# Patient Record
Sex: Male | Born: 1955 | Hispanic: Yes | Smoking: Never smoker
Health system: Southern US, Community
[De-identification: ages and names within clinical notes are randomized; demographics above are authoritative.]

## PROBLEM LIST (undated history)

## (undated) DIAGNOSIS — K219 Gastro-esophageal reflux disease without esophagitis: Secondary | ICD-10-CM

---

## 2006-05-20 ENCOUNTER — Ambulatory Visit: Payer: Self-pay | Admitting: Gastroenterology

## 2006-05-24 ENCOUNTER — Ambulatory Visit: Payer: Self-pay | Admitting: Cardiology

## 2006-05-29 ENCOUNTER — Ambulatory Visit: Payer: Self-pay | Admitting: Gastroenterology

## 2006-06-03 ENCOUNTER — Ambulatory Visit: Payer: Self-pay | Admitting: Gastroenterology

## 2008-05-17 IMAGING — CT CT PELVIS W/ CM
2 of 5 series · 17 of 46 positions shown, 19 images · IV contrast (APPLIED)
Comparison: none

CLINICAL DATA: Left upper quadrant pain.  Right flank pain. 
 ABDOMEN CT WITH CONTRAST:
TECHNIQUE: Multidetector CT imaging of the abdomen was performed following the standard protocol during bolus administration of intravenous contrast.
 Contrast:  125 cc Omnipaque 300.
TECHNIQUE: Multidetector CT imaging of the pelvis was performed following the standard protocol during bolus administration of intravenous contrast.

[Series 2: abd_pel 5.0 b30f st · axial · 0.76mm/px · z∈[-382,-26]mm · 14 of 81 slices shown, 16 images]
[im 5/81  soft-tissue]
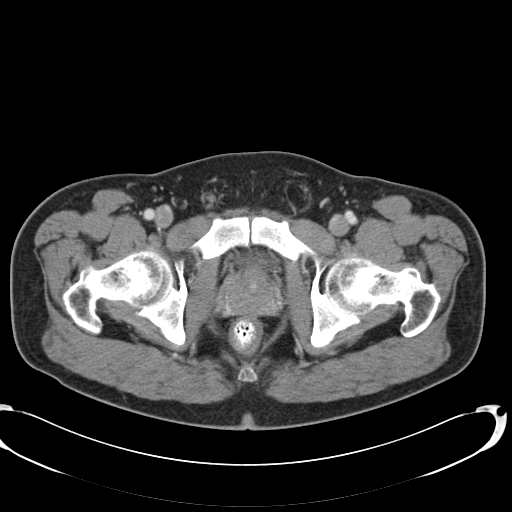
[im 5/81  bone]
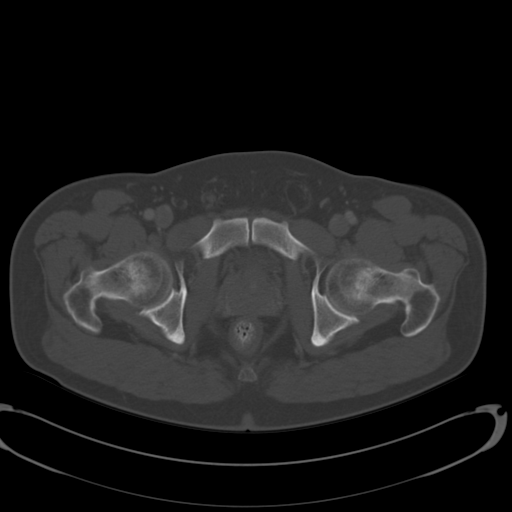
[im 9/81  soft-tissue]
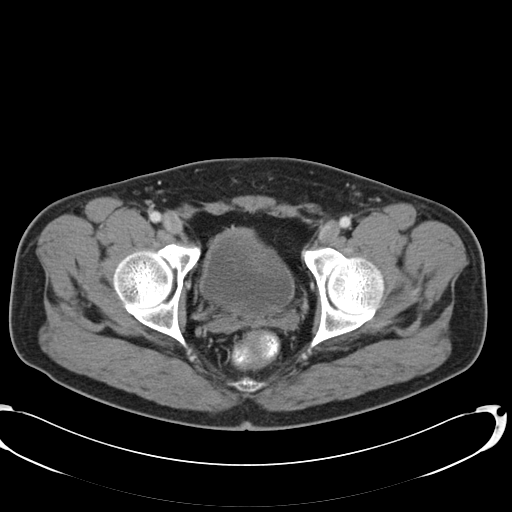
[im 18/81  soft-tissue]
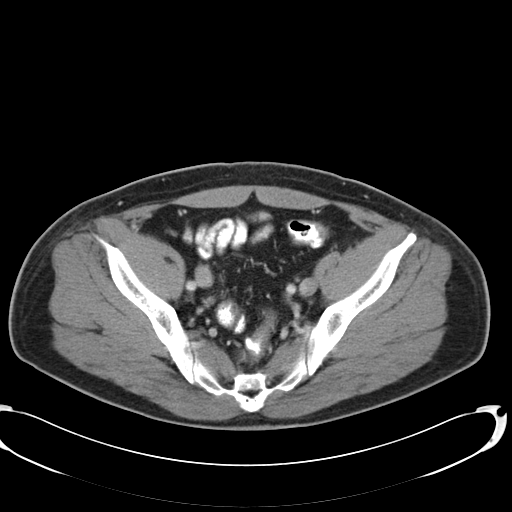
[im 23/81  soft-tissue]
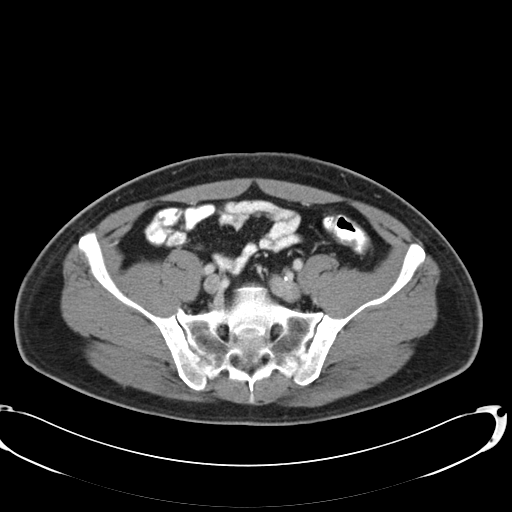
[im 27/81  soft-tissue]
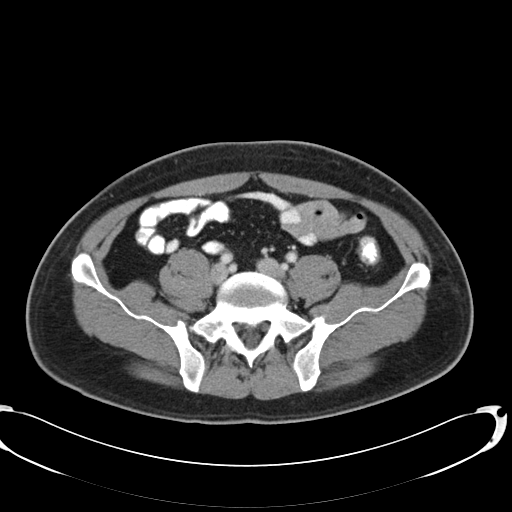
[im 32/81  soft-tissue]
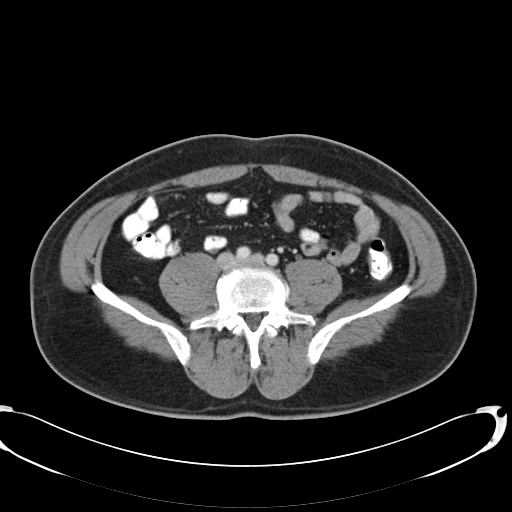
[im 36/81  soft-tissue]
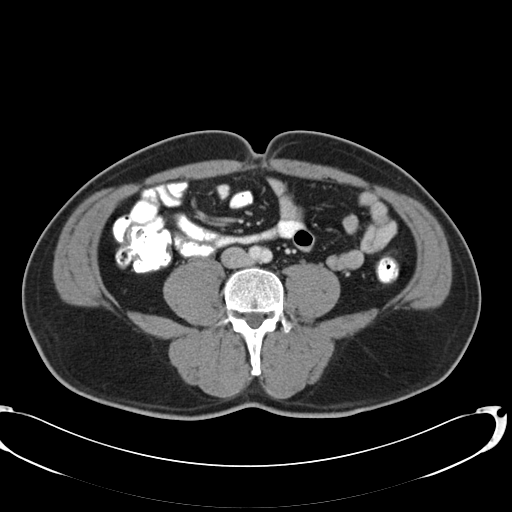
[im 45/81  soft-tissue]
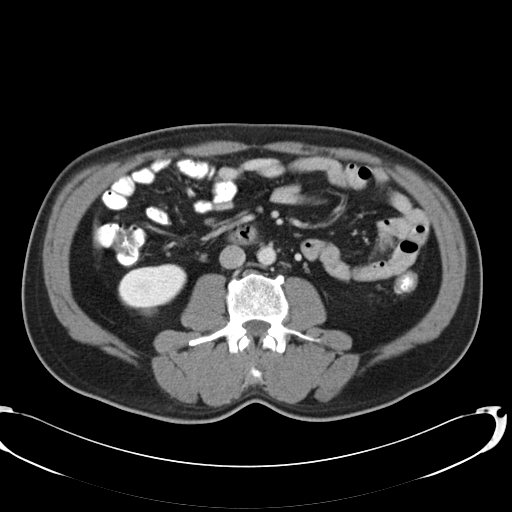
[im 49/81  soft-tissue]
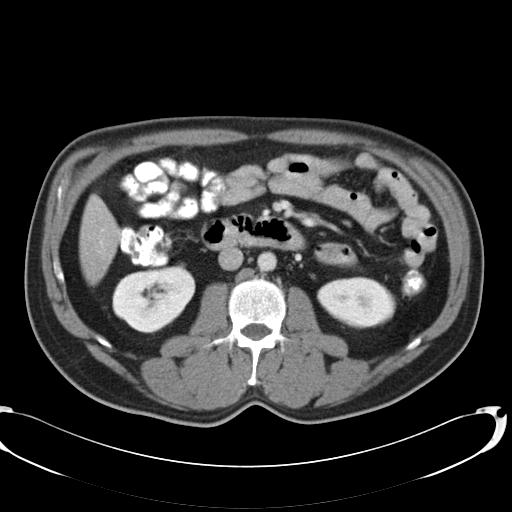
[im 49/81  bone]
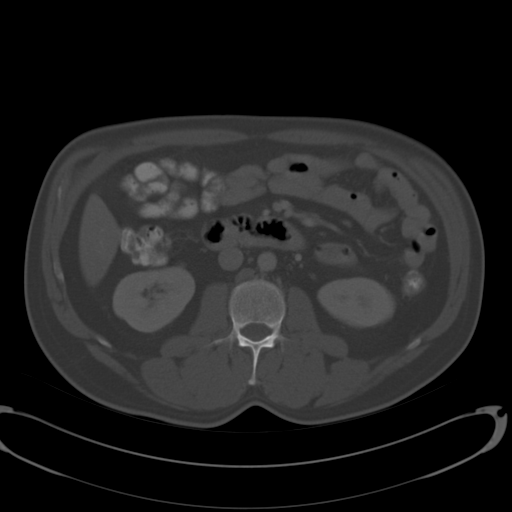
[im 54/81  soft-tissue]
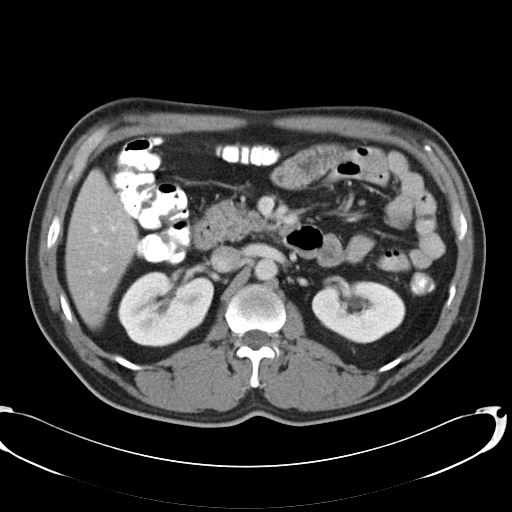
[im 58/81  soft-tissue]
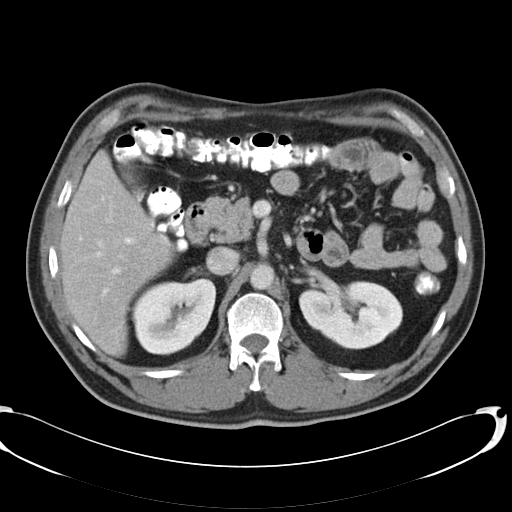
[im 63/81  soft-tissue]
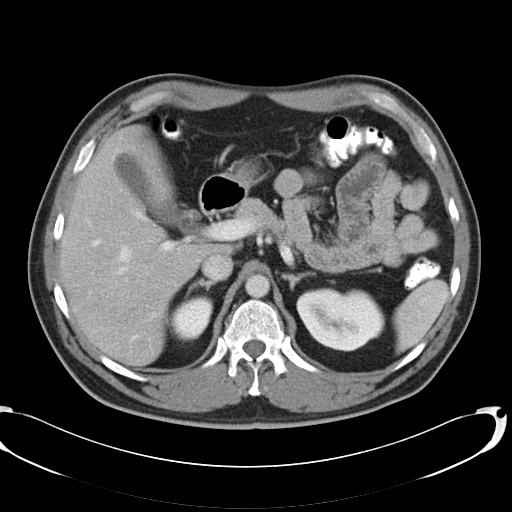
[im 72/81  soft-tissue]
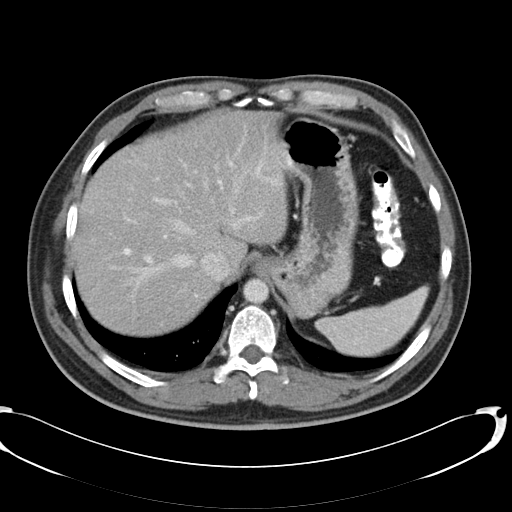
[im 76/81  soft-tissue]
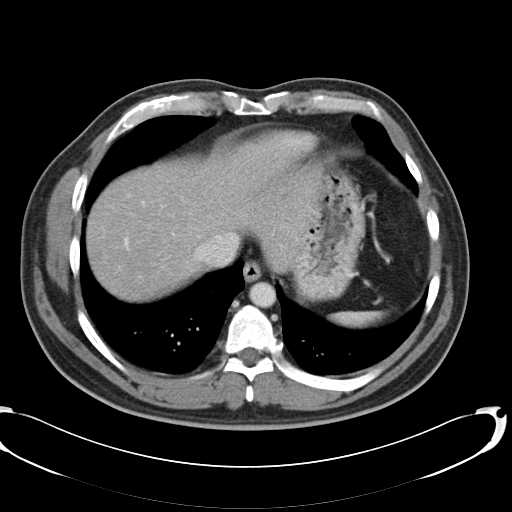

[Series 602: <mpr range> · coronal · 0.80mm/px · 3 of 39 slices shown]
[im 13/39  soft-tissue]
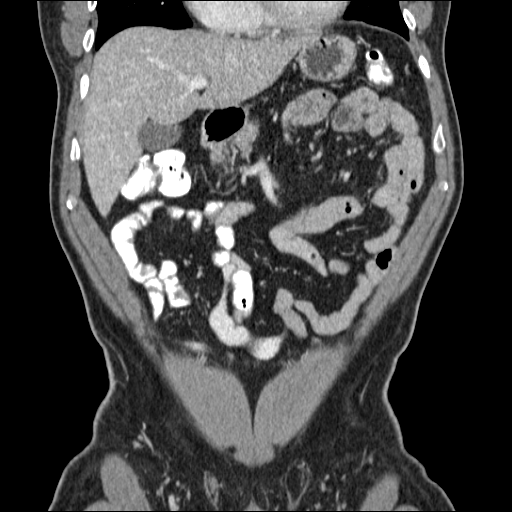
[im 17/39  soft-tissue]
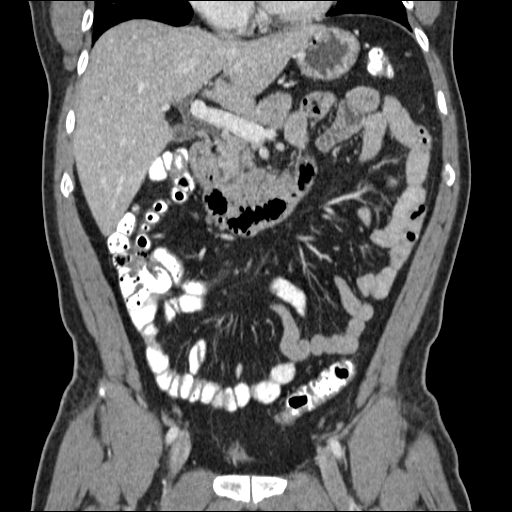
[im 22/39  soft-tissue]
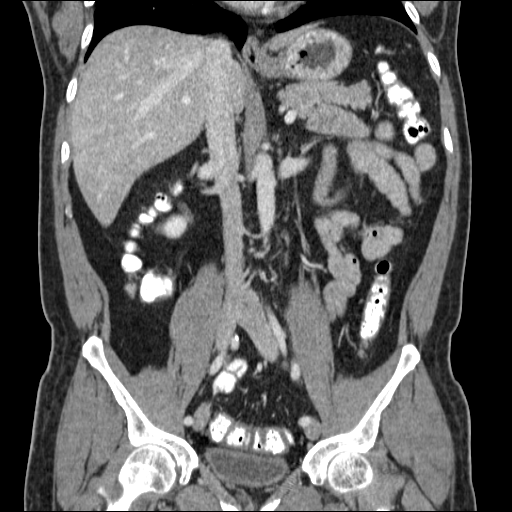

[17 of 46 positions shown; findings below may reference images not displayed]

FINDINGS: Lung bases are clear.  No pleural or pericardial effusion.  The spleen, adrenal glands, kidneys, pancreas, gallbladder, and biliary tree are all unremarkable.  There is a tiny low attenuation lesion in the right lobe of the liver likely representing a small cyst.  No other focal liver lesion.  Stomach and small bowel appear normal.  There is no abdominal lymphadenopathy or fluid collection.  No focal bony abnormality.
IMPRESSION: No acute finding in the abdomen with a small hepatic cyst noted. 
 PELVIS CT WITH CONTRAST:
FINDINGS: Colon is normal in appearance.  Appendix is well seen and also normal.  There is no pelvic fluid collection or lymphadenopathy.  No focal bony abnormality.
IMPRESSION: Negative pelvic CT.

## 2011-08-31 ENCOUNTER — Ambulatory Visit (INDEPENDENT_AMBULATORY_CARE_PROVIDER_SITE_OTHER): Payer: Self-pay

## 2011-08-31 DIAGNOSIS — J159 Unspecified bacterial pneumonia: Secondary | ICD-10-CM

## 2014-04-26 ENCOUNTER — Ambulatory Visit (INDEPENDENT_AMBULATORY_CARE_PROVIDER_SITE_OTHER): Payer: BC Managed Care – PPO | Admitting: Emergency Medicine

## 2014-04-26 VITALS — BP 142/68 | HR 71 | Temp 97.5°F | Resp 16 | Ht 62.5 in | Wt 152.6 lb

## 2014-04-26 DIAGNOSIS — K2970 Gastritis, unspecified, without bleeding: Secondary | ICD-10-CM

## 2014-04-26 DIAGNOSIS — K219 Gastro-esophageal reflux disease without esophagitis: Secondary | ICD-10-CM

## 2014-04-26 DIAGNOSIS — R079 Chest pain, unspecified: Secondary | ICD-10-CM

## 2014-04-26 DIAGNOSIS — K2990 Gastroduodenitis, unspecified, without bleeding: Secondary | ICD-10-CM

## 2014-04-26 LAB — POCT CBC
Granulocyte percent: 64.2 %G (ref 37–80)
HCT, POC: 45 % (ref 43.5–53.7)
HEMOGLOBIN: 14.4 g/dL (ref 14.1–18.1)
Lymph, poc: 2 (ref 0.6–3.4)
MCH: 34.5 pg — AB (ref 27–31.2)
MCHC: 31.9 g/dL (ref 31.8–35.4)
MCV: 108.1 fL — AB (ref 80–97)
MID (CBC): 0.5 (ref 0–0.9)
MPV: 6.6 fL (ref 0–99.8)
PLATELET COUNT, POC: 286 10*3/uL (ref 142–424)
POC GRANULOCYTE: 4.5 (ref 2–6.9)
POC LYMPH PERCENT: 28.3 %L (ref 10–50)
POC MID %: 7.5 % (ref 0–12)
RBC: 4.16 M/uL — AB (ref 4.69–6.13)
RDW, POC: 16.9 %
WBC: 7 10*3/uL (ref 4.6–10.2)

## 2014-04-26 LAB — COMPREHENSIVE METABOLIC PANEL
ALT: 33 U/L (ref 0–53)
AST: 28 U/L (ref 0–37)
Albumin: 4.7 g/dL (ref 3.5–5.2)
Alkaline Phosphatase: 122 U/L — ABNORMAL HIGH (ref 39–117)
BILIRUBIN TOTAL: 0.3 mg/dL (ref 0.2–1.2)
BUN: 16 mg/dL (ref 6–23)
CALCIUM: 9.6 mg/dL (ref 8.4–10.5)
CHLORIDE: 102 meq/L (ref 96–112)
CO2: 24 meq/L (ref 19–32)
CREATININE: 0.68 mg/dL (ref 0.50–1.35)
Glucose, Bld: 91 mg/dL (ref 70–99)
Potassium: 4.1 mEq/L (ref 3.5–5.3)
SODIUM: 136 meq/L (ref 135–145)
TOTAL PROTEIN: 7.6 g/dL (ref 6.0–8.3)

## 2014-04-26 LAB — LIPID PANEL
CHOL/HDL RATIO: 4.5 ratio
CHOLESTEROL: 125 mg/dL (ref 0–200)
HDL: 28 mg/dL — ABNORMAL LOW (ref >39)
LDL Cholesterol: 35 mg/dL (ref 0–99)
Triglycerides: 309 mg/dL — ABNORMAL HIGH (ref <150)
VLDL: 62 mg/dL — AB (ref 0–40)

## 2014-04-26 MED ORDER — LANSOPRAZOLE 30 MG PO CPDR: 30.0000 mg | DELAYED_RELEASE_CAPSULE | Freq: Every day | ORAL | Status: DC

## 2014-04-26 MED ORDER — SUCRALFATE 1 G PO TABS
ORAL_TABLET | ORAL | Status: DC
Start: 2014-04-26 — End: 2016-03-03

## 2014-04-26 NOTE — Patient Instructions (Signed)
Gastroesophageal Reflux Disease, Adult Gastroesophageal reflux disease (GERD) happens when acid from your stomach flows up into the esophagus. When acid comes in contact with the esophagus, the acid causes soreness (inflammation) in the esophagus. Over time, GERD may create small holes (ulcers) in the lining of the esophagus. CAUSES   Increased body weight. This puts pressure on the stomach, making acid rise from the stomach into the esophagus.  Smoking. This increases acid production in the stomach.  Drinking alcohol. This causes decreased pressure in the lower esophageal sphincter (valve or ring of muscle between the esophagus and stomach), allowing acid from the stomach into the esophagus.  Late evening meals and a full stomach. This increases pressure and acid production in the stomach.  A malformed lower esophageal sphincter. Sometimes, no cause is found. SYMPTOMS   Burning pain in the lower part of the mid-chest behind the breastbone and in the mid-stomach area. This may occur twice a week or more often.  Trouble swallowing.  Sore throat.  Dry cough.  Asthma-like symptoms including chest tightness, shortness of breath, or wheezing. DIAGNOSIS  Your caregiver may be able to diagnose GERD based on your symptoms. In some cases, X-rays and other tests may be done to check for complications or to check the condition of your stomach and esophagus. TREATMENT  Your caregiver may recommend over-the-counter or prescription medicines to help decrease acid production. Ask your caregiver before starting or adding any new medicines.  HOME CARE INSTRUCTIONS   Change the factors that you can control. Ask your caregiver for guidance concerning weight loss, quitting smoking, and alcohol consumption.  Avoid foods and drinks that make your symptoms worse, such as:  Caffeine or alcoholic drinks.  Chocolate.  Peppermint or mint flavorings.  Garlic and onions.  Spicy foods.  Citrus fruits,  such as oranges, lemons, or limes.  Tomato-based foods such as sauce, chili, salsa, and pizza.  Fried and fatty foods.  Avoid lying down for the 3 hours prior to your bedtime or prior to taking a nap.  Eat small, frequent meals instead of large meals.  Wear loose-fitting clothing. Do not wear anything tight around your waist that causes pressure on your stomach.  Raise the head of your bed 6 to 8 inches with wood blocks to help you sleep. Extra pillows will not help.  Only take over-the-counter or prescription medicines for pain, discomfort, or fever as directed by your caregiver.  Do not take aspirin, ibuprofen, or other nonsteroidal anti-inflammatory drugs (NSAIDs). SEEK IMMEDIATE MEDICAL CARE IF:   You have pain in your arms, neck, jaw, teeth, or back.  Your pain increases or changes in intensity or duration.  You develop nausea, vomiting, or sweating (diaphoresis).  You develop shortness of breath, or you faint.  Your vomit is green, yellow, black, or looks like coffee grounds or blood.  Your stool is red, bloody, or black. These symptoms could be signs of other problems, such as heart disease, gastric bleeding, or esophageal bleeding. MAKE SURE YOU:   Understand these instructions.  Will watch your condition.  Will get help right away if you are not doing well or get worse. Document Released: 05/23/2005 Document Revised: 11/05/2011 Document Reviewed: 03/02/2011 Exit[employer] Patient Information 2015 [employer]. This information is not intended to replace advice given to you by your health care provider. Make sure you discuss any questions you have with your health care provider.  

## 2014-04-26 NOTE — Progress Notes (Signed)
[facility] [address] [place] [address] [phone]  Date:  04/26/2014   Name:  Jose Compton   DOB:  08/11/1956   MRN:  [id number]  PCP:  No PCP Per Patient    Chief Complaint: Other and Chest Pain   History of Present Illness:  Jose Compton is a 58 y.o. very pleasant male patient who presents with the following:  Three day history of pain in left upper chest. No radiation of pain.  Pain worse when lays down at night.  No cough. No nausea or vomiting.  No shortness of breath, no diaphoresis.  No palpitations or tachycardia. Non smoker.  No hypertension.  No DM, HLD.   No history of CAD.   History of GERD off medications. Pain tearing, not dull or heavy.  Intolerance to spicy food.  No other food intolerance. No improvement with over the counter medications or other home remedies. Denies other complaint or health concern today.   There are no active problems to display for this patient.   History reviewed. No pertinent past medical history.  History reviewed. No pertinent past surgical history.  History  Substance Use Topics  . Smoking status: Never Smoker   . Smokeless tobacco: Not on file  . Alcohol Use: No    Family History  Problem Relation Age of Onset  . Stroke Mother     No Known Allergies  Medication list has been reviewed and updated.  No current outpatient prescriptions on file prior to visit.   No current facility-administered medications on file prior to visit.    Review of Systems:  As per HPI, otherwise negative.    Physical Examination: Filed Vitals:   04/26/14 1441  BP: 142/68  Pulse: 71  Temp: 97.5 F (36.4 C)  Resp: 16   Filed Vitals:   04/26/14 1441  Height: 5' 2.5" (1.588 m)  Weight: 152 lb 9.6 oz (69.219 kg)   Body mass index is 27.45 kg/(m^2). Ideal Body Weight: Weight in (lb) to have BMI = 25: 138.6  GEN: WDWN, NAD, Non-toxic, A & O x 3 HEENT: Atraumatic, Normocephalic. Neck supple.  No masses, No LAD. Ears and Nose: No external deformity. CV: RRR, No M/G/R. No JVD. No thrill. No extra heart sounds. PULM: CTA B, no wheezes, crackles, rhonchi. No retractions. No resp. distress. No accessory muscle use. ABD: S, NT, ND, +BS. No rebound. No HSM. EXTR: No c/c/e NEURO Normal gait.  PSYCH: Normally interactive. Conversant. Not depressed or anxious appearing.  Calm demeanor.    Assessment and Plan: Chest pain Labs pending Gastritis vs reflux Prevacid carafate  Signed,  Phillips Odor, MD

## 2014-04-27 LAB — H. PYLORI ANTIBODY, IGG

## 2016-02-28 ENCOUNTER — Inpatient Hospital Stay (HOSPITAL_COMMUNITY)
Admission: EM | Admit: 2016-02-28 | Discharge: 2016-03-03 | DRG: 378 | Disposition: A | Discharge status: Home / Self Care | Payer: BLUE CROSS/BLUE SHIELD | Attending: Internal Medicine | Admitting: Internal Medicine

## 2016-02-28 ENCOUNTER — Encounter (HOSPITAL_COMMUNITY): Payer: Self-pay | Admitting: *Deleted

## 2016-02-28 DIAGNOSIS — D7589 Other specified diseases of blood and blood-forming organs: Secondary | ICD-10-CM | POA: Diagnosis present

## 2016-02-28 DIAGNOSIS — K922 Gastrointestinal hemorrhage, unspecified: Secondary | ICD-10-CM | POA: Diagnosis present

## 2016-02-28 DIAGNOSIS — R1310 Dysphagia, unspecified: Secondary | ICD-10-CM | POA: Diagnosis present

## 2016-02-28 DIAGNOSIS — D696 Thrombocytopenia, unspecified: Secondary | ICD-10-CM | POA: Insufficient documentation

## 2016-02-28 DIAGNOSIS — R109 Unspecified abdominal pain: Secondary | ICD-10-CM | POA: Diagnosis not present

## 2016-02-28 DIAGNOSIS — R12 Heartburn: Secondary | ICD-10-CM

## 2016-02-28 DIAGNOSIS — K219 Gastro-esophageal reflux disease without esophagitis: Secondary | ICD-10-CM | POA: Diagnosis present

## 2016-02-28 DIAGNOSIS — D531 Other megaloblastic anemias, not elsewhere classified: Secondary | ICD-10-CM | POA: Diagnosis present

## 2016-02-28 DIAGNOSIS — D539 Nutritional anemia, unspecified: Secondary | ICD-10-CM | POA: Diagnosis present

## 2016-02-28 DIAGNOSIS — D510 Vitamin B12 deficiency anemia due to intrinsic factor deficiency: Secondary | ICD-10-CM | POA: Diagnosis present

## 2016-02-28 DIAGNOSIS — D61818 Other pancytopenia: Secondary | ICD-10-CM | POA: Diagnosis present

## 2016-02-28 DIAGNOSIS — Z79899 Other long term (current) drug therapy: Secondary | ICD-10-CM

## 2016-02-28 DIAGNOSIS — R195 Other fecal abnormalities: Secondary | ICD-10-CM | POA: Diagnosis present

## 2016-02-28 DIAGNOSIS — D649 Anemia, unspecified: Secondary | ICD-10-CM

## 2016-02-28 DIAGNOSIS — D519 Vitamin B12 deficiency anemia, unspecified: Secondary | ICD-10-CM | POA: Diagnosis present

## 2016-02-28 DIAGNOSIS — E876 Hypokalemia: Secondary | ICD-10-CM | POA: Diagnosis present

## 2016-02-28 DIAGNOSIS — Z823 Family history of stroke: Secondary | ICD-10-CM

## 2016-02-28 HISTORY — DX: Gastro-esophageal reflux disease without esophagitis: K21.9

## 2016-02-28 LAB — COMPREHENSIVE METABOLIC PANEL
ALBUMIN: 4.1 g/dL (ref 3.5–5.0)
ALT: 22 U/L (ref 17–63)
ANION GAP: 5 (ref 5–15)
AST: 34 U/L (ref 15–41)
Alkaline Phosphatase: 53 U/L (ref 38–126)
BUN: 19 mg/dL (ref 6–20)
CALCIUM: 7.9 mg/dL — AB (ref 8.9–10.3)
CHLORIDE: 104 mmol/L (ref 101–111)
CO2: 22 mmol/L (ref 22–32)
CREATININE: 0.79 mg/dL (ref 0.61–1.24)
GFR calc non Af Amer: >60 mL/min (ref >60)
Glucose, Bld: 95 mg/dL (ref 65–99)
Potassium: 3.2 mmol/L — ABNORMAL LOW (ref 3.5–5.1)
SODIUM: 131 mmol/L — AB (ref 135–145)
TOTAL PROTEIN: 6.2 g/dL — AB (ref 6.5–8.1)
Total Bilirubin: 1.5 mg/dL — ABNORMAL HIGH (ref 0.3–1.2)

## 2016-02-28 LAB — ABO/RH: ABO/RH(D): O POS

## 2016-02-28 LAB — URINALYSIS, ROUTINE W REFLEX MICROSCOPIC
BILIRUBIN URINE: NEGATIVE
Glucose, UA: NEGATIVE mg/dL
Hgb urine dipstick: NEGATIVE
KETONES UR: NEGATIVE mg/dL
LEUKOCYTES UA: NEGATIVE
NITRITE: NEGATIVE
PH: 5.5 (ref 5.0–8.0)
PROTEIN: NEGATIVE mg/dL
Specific Gravity, Urine: 1.02 (ref 1.005–1.030)

## 2016-02-28 LAB — CBC
HCT: 12.1 % — ABNORMAL LOW (ref 39.0–52.0)
Hemoglobin: 4.4 g/dL — CL (ref 13.0–17.0)
MCH: 41.5 pg — AB (ref 26.0–34.0)
MCHC: 36.4 g/dL — ABNORMAL HIGH (ref 30.0–36.0)
MCV: 114.2 fL — ABNORMAL HIGH (ref 78.0–100.0)
PLATELETS: 85 10*3/uL — AB (ref 150–400)
RBC: 1.06 MIL/uL — AB (ref 4.22–5.81)
RDW: 24.9 % — ABNORMAL HIGH (ref 11.5–15.5)
WBC: 3.2 10*3/uL — ABNORMAL LOW (ref 4.0–10.5)

## 2016-02-28 LAB — DIFFERENTIAL
BASOS PCT: 0 %
Basophils Absolute: 0 10*3/uL (ref 0.0–0.1)
EOS ABS: 0.2 10*3/uL (ref 0.0–0.7)
Eosinophils Relative: 5 %
LYMPHS ABS: 1.4 10*3/uL (ref 0.7–4.0)
Lymphocytes Relative: 43 %
MONO ABS: 0.1 10*3/uL (ref 0.1–1.0)
Monocytes Relative: 4 %
NEUTROS ABS: 1.5 10*3/uL — AB (ref 1.7–7.7)
NEUTROS PCT: 48 %
WBC Morphology: INCREASED

## 2016-02-28 LAB — PREPARE RBC (CROSSMATCH)

## 2016-02-28 LAB — POC OCCULT BLOOD, ED: Fecal Occult Bld: POSITIVE — AB

## 2016-02-28 LAB — LIPASE, BLOOD: LIPASE: 31 U/L (ref 11–51)

## 2016-02-28 MED ORDER — SODIUM CHLORIDE 0.9 % IV BOLUS (SEPSIS)
1000.0000 mL | Freq: Once | INTRAVENOUS | Status: AC
  Administered 2016-02-28: 1000 mL via INTRAVENOUS

## 2016-02-28 MED ORDER — ONDANSETRON HCL 4 MG/2ML IJ SOLN
4.0000 mg | Freq: Once | INTRAMUSCULAR | Status: DC | PRN
  Filled 2016-02-28: qty 2

## 2016-02-28 MED ORDER — METOCLOPRAMIDE HCL 5 MG/ML IJ SOLN
10.0000 mg | Freq: Once | INTRAMUSCULAR | Status: AC
  Administered 2016-02-28: 10 mg via INTRAVENOUS
  Filled 2016-02-28: qty 2

## 2016-02-28 MED ORDER — SODIUM CHLORIDE 0.9 % IV BOLUS (SEPSIS): 1000.0000 mL | Freq: Once | INTRAVENOUS | Status: DC

## 2016-02-28 MED ORDER — FAMOTIDINE IN NACL 20-0.9 MG/50ML-% IV SOLN
20.0000 mg | Freq: Once | INTRAVENOUS | Status: AC
  Administered 2016-02-28: 20 mg via INTRAVENOUS
  Filled 2016-02-28: qty 50

## 2016-02-28 NOTE — ED Provider Notes (Signed)
Complains of left-sided abdominal pain onset this morning. Pain is mild radiates to left flank. No associated nausea or vomiting. No blood per rectum. Also complains of generalized weakness for approximately a week. On exam alert no distress HEENT exam conjunctiva are pale lungs clear auscultation heart regular rate and rhythm abdomen mildly tender at left mid abdomen no guarding rigidity or rebound no masses. No flank tenderness. Declines pain medicine presently  Doug SouSam Carlye Panameno, MD 02/29/16 [phone]

## 2016-02-28 NOTE — ED Notes (Signed)
Pt c/o abd pain / radiating to both sides; pt states that he was diagnosed with GERD over a month ago and has been taking prilosec and omperazole for over a month without improvement; pt states that he has burning to his stomach and throat; pt also c/o flank pain and states that he has been urinating more frequently and has had blood in it at times over that last few days; pt c/o nausea with no vomiting

## 2016-02-28 NOTE — ED Notes (Signed)
Lab called asked for recollect/ Took 20cc ( wasted 10 cc ) And called lab to let know the recollect was tubed up    Note peptid iv and bolus were stopped.

## 2016-02-29 ENCOUNTER — Inpatient Hospital Stay (HOSPITAL_COMMUNITY): Payer: BLUE CROSS/BLUE SHIELD

## 2016-02-29 ENCOUNTER — Encounter (HOSPITAL_COMMUNITY): Payer: Self-pay

## 2016-02-29 ENCOUNTER — Emergency Department (HOSPITAL_COMMUNITY): Payer: BLUE CROSS/BLUE SHIELD

## 2016-02-29 DIAGNOSIS — K219 Gastro-esophageal reflux disease without esophagitis: Secondary | ICD-10-CM | POA: Diagnosis present

## 2016-02-29 DIAGNOSIS — R109 Unspecified abdominal pain: Secondary | ICD-10-CM | POA: Diagnosis present

## 2016-02-29 DIAGNOSIS — E876 Hypokalemia: Secondary | ICD-10-CM | POA: Diagnosis present

## 2016-02-29 DIAGNOSIS — D61818 Other pancytopenia: Secondary | ICD-10-CM | POA: Diagnosis present

## 2016-02-29 DIAGNOSIS — Z79899 Other long term (current) drug therapy: Secondary | ICD-10-CM | POA: Diagnosis not present

## 2016-02-29 DIAGNOSIS — R195 Other fecal abnormalities: Secondary | ICD-10-CM

## 2016-02-29 DIAGNOSIS — R1310 Dysphagia, unspecified: Secondary | ICD-10-CM | POA: Diagnosis present

## 2016-02-29 DIAGNOSIS — K922 Gastrointestinal hemorrhage, unspecified: Secondary | ICD-10-CM | POA: Diagnosis present

## 2016-02-29 DIAGNOSIS — Z823 Family history of stroke: Secondary | ICD-10-CM | POA: Diagnosis not present

## 2016-02-29 DIAGNOSIS — R531 Weakness: Secondary | ICD-10-CM | POA: Diagnosis not present

## 2016-02-29 DIAGNOSIS — D649 Anemia, unspecified: Secondary | ICD-10-CM | POA: Diagnosis not present

## 2016-02-29 DIAGNOSIS — D510 Vitamin B12 deficiency anemia due to intrinsic factor deficiency: Secondary | ICD-10-CM | POA: Diagnosis present

## 2016-02-29 DIAGNOSIS — D519 Vitamin B12 deficiency anemia, unspecified: Secondary | ICD-10-CM | POA: Diagnosis not present

## 2016-02-29 DIAGNOSIS — D696 Thrombocytopenia, unspecified: Secondary | ICD-10-CM | POA: Diagnosis not present

## 2016-02-29 DIAGNOSIS — D531 Other megaloblastic anemias, not elsewhere classified: Secondary | ICD-10-CM | POA: Diagnosis present

## 2016-02-29 DIAGNOSIS — R634 Abnormal weight loss: Secondary | ICD-10-CM | POA: Diagnosis not present

## 2016-02-29 DIAGNOSIS — D7589 Other specified diseases of blood and blood-forming organs: Secondary | ICD-10-CM | POA: Diagnosis present

## 2016-02-29 LAB — GLUCOSE, CAPILLARY
GLUCOSE-CAPILLARY: 111 mg/dL — AB (ref 65–99)
Glucose-Capillary: 122 mg/dL — ABNORMAL HIGH (ref 65–99)

## 2016-02-29 LAB — COMPREHENSIVE METABOLIC PANEL
ALK PHOS: 57 U/L (ref 38–126)
ALT: 26 U/L (ref 17–63)
ANION GAP: 3 — AB (ref 5–15)
AST: 39 U/L (ref 15–41)
Albumin: 3.9 g/dL (ref 3.5–5.0)
BILIRUBIN TOTAL: 1.7 mg/dL — AB (ref 0.3–1.2)
BUN: 12 mg/dL (ref 6–20)
CALCIUM: 7.6 mg/dL — AB (ref 8.9–10.3)
CO2: 22 mmol/L (ref 22–32)
CREATININE: 0.69 mg/dL (ref 0.61–1.24)
Chloride: 110 mmol/L (ref 101–111)
Glucose, Bld: 106 mg/dL — ABNORMAL HIGH (ref 65–99)
Potassium: 4 mmol/L (ref 3.5–5.1)
Sodium: 135 mmol/L (ref 135–145)
TOTAL PROTEIN: 5.8 g/dL — AB (ref 6.5–8.1)

## 2016-02-29 LAB — IRON AND TIBC
IRON: 196 ug/dL — AB (ref 45–182)
Saturation Ratios: 86 % — ABNORMAL HIGH (ref 17.9–39.5)
TIBC: 228 ug/dL — ABNORMAL LOW (ref 250–450)
UIBC: 32 ug/dL

## 2016-02-29 LAB — TROPONIN I

## 2016-02-29 LAB — HEMOGLOBIN AND HEMATOCRIT, BLOOD
HEMATOCRIT: 19.2 % — AB (ref 39.0–52.0)
HEMOGLOBIN: 6.9 g/dL — AB (ref 13.0–17.0)

## 2016-02-29 LAB — RETICULOCYTES
RBC.: 1.93 MIL/uL — AB (ref 4.22–5.81)
RETIC COUNT ABSOLUTE: 34.7 10*3/uL (ref 19.0–186.0)
Retic Ct Pct: 1.8 % (ref 0.4–3.1)

## 2016-02-29 LAB — VITAMIN B12: Vitamin B-12: 27 pg/mL — ABNORMAL LOW (ref 180–914)

## 2016-02-29 LAB — FERRITIN: Ferritin: 336 ng/mL (ref 24–336)

## 2016-02-29 LAB — PREPARE RBC (CROSSMATCH)

## 2016-02-29 LAB — FOLATE: FOLATE: 20.9 ng/mL (ref >5.9)

## 2016-02-29 MED ORDER — SODIUM CHLORIDE 0.9 % IV SOLN
8.0000 mg/h | INTRAVENOUS | Status: DC
  Administered 2016-02-29 – 2016-03-01 (×4): 8 mg/h via INTRAVENOUS
  Filled 2016-02-29 (×6): qty 80

## 2016-02-29 MED ORDER — ACETAMINOPHEN 650 MG RE SUPP: 650.0000 mg | Freq: Four times a day (QID) | RECTAL | Status: DC | PRN

## 2016-02-29 MED ORDER — MORPHINE SULFATE (PF) 2 MG/ML IV SOLN: 1.0000 mg | INTRAVENOUS | Status: DC | PRN

## 2016-02-29 MED ORDER — PEG-KCL-NACL-NASULF-NA ASC-C 100 G PO SOLR: 1.0000 | Freq: Once | ORAL | Status: DC

## 2016-02-29 MED ORDER — SODIUM CHLORIDE 0.9 % IV SOLN
Freq: Once | INTRAVENOUS | Status: AC
  Administered 2016-02-29: 16:00:00 via INTRAVENOUS

## 2016-02-29 MED ORDER — FOLIC ACID 1 MG PO TABS
1.0000 mg | ORAL_TABLET | Freq: Every day | ORAL | Status: DC
Start: 2016-03-01 — End: 2016-03-01

## 2016-02-29 MED ORDER — ACETAMINOPHEN 325 MG PO TABS
650.0000 mg | ORAL_TABLET | Freq: Four times a day (QID) | ORAL | Status: DC | PRN
  Administered 2016-02-29 – 2016-03-03 (×3): 650 mg via ORAL
  Filled 2016-02-29 (×3): qty 2

## 2016-02-29 MED ORDER — PANTOPRAZOLE SODIUM 40 MG IV SOLR: 40.0000 mg | Freq: Two times a day (BID) | INTRAVENOUS | Status: DC

## 2016-02-29 MED ORDER — ONDANSETRON HCL 4 MG/2ML IJ SOLN: 4.0000 mg | Freq: Four times a day (QID) | INTRAMUSCULAR | Status: DC | PRN

## 2016-02-29 MED ORDER — SODIUM CHLORIDE 0.9 % IV SOLN
Freq: Once | INTRAVENOUS | Status: AC
  Administered 2016-02-29: 02:00:00 via INTRAVENOUS

## 2016-02-29 MED ORDER — VITAMIN B-12 100 MCG PO TABS
100.0000 ug | ORAL_TABLET | Freq: Every day | ORAL | Status: DC
  Filled 2016-02-29: qty 1

## 2016-02-29 MED ORDER — SODIUM CHLORIDE 0.9 % IV SOLN
80.0000 mg | Freq: Once | INTRAVENOUS | Status: AC
  Administered 2016-02-29: 80 mg via INTRAVENOUS
  Filled 2016-02-29: qty 80

## 2016-02-29 MED ORDER — IOPAMIDOL (ISOVUE-300) INJECTION 61%
100.0000 mL | Freq: Once | INTRAVENOUS | Status: AC | PRN
  Administered 2016-02-29: 100 mL via INTRAVENOUS

## 2016-02-29 MED ORDER — PEG-KCL-NACL-NASULF-NA ASC-C 100 G PO SOLR
0.5000 | Freq: Once | ORAL | Status: AC
  Administered 2016-02-29: 100 g via ORAL
  Filled 2016-02-29: qty 1

## 2016-02-29 MED ORDER — POTASSIUM CHLORIDE IN NACL 20-0.9 MEQ/L-% IV SOLN
INTRAVENOUS | Status: AC
  Administered 2016-02-29 (×2): via INTRAVENOUS
  Filled 2016-02-29 (×2): qty 1000

## 2016-02-29 MED ORDER — ONDANSETRON HCL 4 MG PO TABS: 4.0000 mg | ORAL_TABLET | Freq: Four times a day (QID) | ORAL | Status: DC | PRN

## 2016-02-29 NOTE — Consult Note (Signed)
Consultation  Referring Provider:  [facility]/ Primary Care Physician:  No PCP Per Patient Primary Gastroenterologist:  None/unassigned.  Reason for Consultation:  marked anemia /heme + stool  HPI: Jose Compton is a 60 y.o. male who was admitted yesterday through the emergency room after he presented with complaints of weakness and left flank pain. He had apparently been feeling very weak over the past 7-10 days. He does have history of GERD and was complaining of an increase in heartburn as well. CT of the abdomen and pelvis was done on admission showed some mild perinephric stranding particularly on the left, otherwise unremarkable. Admission labs show WBC of 3.2, hemoglobin 4.1, hematocrit 12, MCV 114 and platelets 85, LFTs within normal limits with the exception of total bili at 1.5. CBC from 1 year ago showed hemoglobin of 14 hematocrit of 45 and platelet count was within normal limits. After transfusion of 2 units of packed RBCs today WBC is 2.6, hemoglobin 7.1 hematocrit of 20 and platelet count 54,000 Serum iron 196 TIBC 228 saturation 86 folate 20.9 and B12 level low at 27 Stool documented Hemoccult-positive.. Patient relates having prior EGD done about 11 years ago and probable Colon - somewhere in [place]- nothing found.  On further discussion with patient he says he has an having upper abdominal discomfort over the past couple of months and fairly continuous burning in his throat. He also admits to mild dysphagia. He has been eating a very bland diet and says his appetite has been decreased area did He has lost a total of about 30 pounds over the past 2 months. He has not noted any melena or hematochezia. He denies any aspirin or NSAID use. No EtOH. He had started himself on an OTC PPI which has not made much difference.   He says he has never been anemic in the past.    Past Medical History  Diagnosis Date  . GERD (gastroesophageal reflux disease)      History reviewed. No pertinent past surgical history.  Prior to Admission medications   Medication Sig Start Date End Date Taking? Authorizing Provider  omeprazole (PRILOSEC OTC) 20 MG tablet Take 20 mg by mouth daily.   Yes Historical Provider, MD  lansoprazole (PREVACID) 30 MG capsule Take 1 capsule (30 mg total) by mouth daily at 12 noon. Patient not taking: Reported on 02/28/2016 04/26/14   Carmelina Dane, MD  sucralfate (CARAFATE) 1 G tablet 1 tab 1 hr ac and hs Patient not taking: Reported on 02/28/2016 04/26/14   Carmelina Dane, MD    Current Facility-Administered Medications  Medication Dose Route Frequency Provider Last Rate Last Dose  . 0.9 % NaCl with KCl 20 mEq/ L  infusion   Intravenous Continuous Eduard Clos, MD 50 mL/hr at 02/29/16 0436    . acetaminophen (TYLENOL) tablet 650 mg  650 mg Oral Q6H PRN Eduard Clos, MD   650 mg at 02/29/16 9147   Or  . acetaminophen (TYLENOL) suppository 650 mg  650 mg Rectal Q6H PRN Eduard Clos, MD      . morphine 2 MG/ML injection 1 mg  1 mg Intravenous Q4H PRN Coralie Keens, MD      . ondansetron [facility]) tablet 4 mg  4 mg Oral Q6H PRN Eduard Clos, MD       Or  . ondansetron (ZOFRAN) injection 4 mg  4 mg Intravenous Q6H PRN Eduard Clos, MD      . pantoprazole (  PROTONIX) 80 mg in sodium chloride 0.9 % 250 mL (0.32 mg/mL) infusion  8 mg/hr Intravenous Continuous Eduard ClosArshad N Kakrakandy, MD 25 mL/hr at 02/29/16 0245 8 mg/hr at 02/29/16 0245  . [START ON 03/03/2016] pantoprazole (PROTONIX) injection 40 mg  40 mg Intravenous Q12H Eduard ClosArshad N Kakrakandy, MD        Allergies as of 02/28/2016  . (No Known Allergies)    Family History  Problem Relation Age of Onset  . Stroke Mother     Social History   Social History  . Marital Status: Widowed    Spouse Name: N/A  . Number of Children: N/A  . Years of Education: N/A   Occupational History  . Not on file.   Social History Main Topics  .  Smoking status: Never Smoker   . Smokeless tobacco: Not on file  . Alcohol Use: No  . Drug Use: No  . Sexual Activity: Not on file   Other Topics Concern  . Not on file   Social History Narrative    Review of Systems: Pertinent positive and negative review of systems were noted in the above HPI section.  All other review of systems was otherwise negative.  Physical Exam: Vital signs in last 24 hours: Temp:  [97.9 F (36.6 C)-99 F (37.2 C)] 98.3 F (36.8 C) (07/05 0640) Pulse Rate:  [69-83] 73 (07/05 0640) Resp:  [0-18] 18 (07/05 0640) BP: (98-110)/(54-68) 105/68 mmHg (07/05 0640) SpO2:  [97 %-100 %] 100 % (07/05 0640) Weight:  [135 lb 9.6 oz (61.508 kg)-137 lb 9.1 oz (62.4 kg)] 137 lb 9.1 oz (62.4 kg) (07/05 0243) Last BM Date: 02/28/16 General:   Alert,  Well-developed, hispanic male in NAD Head:  Normocephalic and atraumatic. Eyes:  Sclera clear, no icterus.   Conjunctiva pale . Ears:  Normal auditory acuity. Nose:  No deformity, discharge,  or lesions. Mouth:  No deformity or lesions.   Neck:  Supple; no masses or thyromegaly. Lungs:  Clear throughout to auscultation.   No wheezes, crackles, or rhonchi. Heart:  Regular rate and rhythm; no murmurs, clicks, rubs,  or gallops. Abdomen:  Soft,epigastrium and LUQ tenderness, BS active,nonpalp mass or hsm.   Rectal:  Deferred -documented heme + Msk:  Symmetrical without gross deformities. . Pulses:  Normal pulses noted. Extremities:  Without clubbing or edema. Neurologic:  Alert and  oriented x4;  grossly normal neurologically. Skin:  Intact without significant lesions or rashes.. Psych:  Alert and cooperative. Normal mood and affect.  Intake/Output from previous day: 07/04 0701 - 07/05 0700 In: 1075.2 [I.V.:99.2; Blood:976] Out: -  Intake/Output this shift:    Lab Results:  Recent Labs  02/28/16 2159 02/29/16 0843  WBC 3.2* 2.6*  HGB 4.4* 7.1*  HCT 12.1* 20.0*  PLT 85* 54*   BMET  Recent Labs   02/28/16 2159 02/29/16 0843  NA 131* 135  K 3.2* 4.0  CL 104 110  CO2 22 22  GLUCOSE 95 106*  BUN 19 12  CREATININE 0.79 0.69  CALCIUM 7.9* 7.6*   LFT  Recent Labs  02/29/16 0843  PROT 5.8*  ALBUMIN 3.9  AST 39  ALT 26  ALKPHOS 57  BILITOT 1.7*   PT/INR No results for input(s): LABPROT, INR in the last 72 hours. Hepatitis Panel No results for input(s): HEPBSAG, HCVAB, HEPAIGM, HEPBIGM in the last 72 hours.     IMPRESSION:   #701  60 year old Hispanic male with complaints of upper abdominal pain, decrease in appetite, heartburn and  30 pound weight loss in the past 2 months. Stool documented Hemoccult positive on admission. #2 marked anemia-not iron deficient. Patient is B12 deficient but also has a significant pancytopenia with further drop in platelet count since admission.  Am concerned about underlying malignancy. Pancytopenia consistent with a myelodysplastic process-rule out secondary to malignancy with bone marrow involvement versus other myelodysplastic syndrome  Plan; Full liquid diet  Patient needs hematology consultation before any Gi evaluation considered  Twice a day PPI  as doing Transfuse to keep hemoglobin 7 or above We will follow with you.  Amy Esterwood  02/29/2016, 11:34 AM   GI ATTENDING  History, laboratories, x-rays reviewed. Patient personally seen and examined. Agree with, perhaps of consultation note as outlined above. Patient has not been feeling well. Significant weight loss. Found to have significant pancytopenia. B12 level low. CT scan does not show significant intra-abdominal pathology. Perinephric stranding of uncertain clinical significance. Recommend thorough hematology evaluation. If significant primary bone marrow disorder, would not pursue GI workup at this time. If correctable bone marrow problem, then correct. Thereafter, (after pancytopenia corrected) would consider colonoscopy and upper endoscopy to evaluate Hemoccult-positive  stool. Could even be done as outpatient.  Wilhemina BonitoJohn N. Eda KeysPerry, Jr., M.D. [facility] Division of Gastroenterology

## 2016-02-29 NOTE — Progress Notes (Signed)
PROGRESS NOTE    Jose Compton  ZOX:[id number] DOB: 07/17/1956 DOA: 02/28/2016 PCP: No PCP Per Patient   Brief Narrative: Lower GI bleed. 60 y.o male with no significant medical history presents with abdominal pain and fatigue, found to have hb of 4 and positive hemoccult testing. Patient NPO and consulted GI.    Assessment & Plan:   Principal Problem:   Acute GI bleeding Active Problems:   Pancytopenia (HCC)   Left flank pain   1. Cardiovascular. Blood pressure stable, no significant tachycardia.  2. Pulmonary. Will continue to monitor oxymetry, no signs of aspiration  3, Nephrology,. Renal function stable, cr at 0.69 with K at 4.0  4. GI. Upper GI bleed, had 2 units prbc on admission follow h&h below 7 will order 3th unit prbc to be transfused, follow on h&h and Gi recommendations. Will continue on ppi IV. Noted positive hem occult stools.   5. Hematology. Pancytopenia. 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  6. Patient at moderate risk for worsneing anemia.   DVT prophylaxis:  Code Status: Family Communication:  Disposition Plan:   Consultants:   Gastroenterology  Procedures:  Antimicrobials:  Subjective: Patient feeling better today, had 2 units prbc transfused since admission, abdominal pain improved, no nausea or vomiting. Positive acid reflux, moderate in intensity.   Objective: Filed Vitals:   02/29/16 0345 02/29/16 0400 02/29/16 0426 02/29/16 0640  BP: 110/60 110/60 103/57 105/68  Pulse: 72 72 72 73  Temp: 98.3 F (36.8 C) 98.3 F (36.8 C) 98.4 F (36.9 C) 98.3 F (36.8 C)  TempSrc: Oral Oral Oral Oral  Resp: 16 16 16 18   Height:      Weight:      SpO2: 100% 100% 97% 100%    Intake/Output Summary (Last 24 hours) at 02/29/16 1029 Last data filed at 02/29/16 0635  Gross per 24 hour  Intake 1075.17 ml  Output       0 ml  Net 1075.17 ml   Filed Weights   02/28/16 2001 02/29/16 0243  Weight: 61.508 kg (135 lb 9.6 oz) 62.4 kg (137 lb 9.1 oz)    Examination:  General exam: not in pain, no dyspnea  E ENT: Mild conjunctival pallor, oral mucosa dry. Respiratory system: no wheezing, rales or rhonchi. Cardiovascular system: S1 & S2 heard, RRR. No JVD, murmurs, rubs, gallops or clicks. No pedal edema. Gastrointestinal system: Abdomen is nondistended, soft and nontender. No organomegaly or masses felt. Normal bowel sounds heard. Central nervous system: Alert and oriented. No focal neurological deficits. Extremities: Symmetric 5 x 5 power. Skin: No rashes, lesions or ulcers   Data Reviewed: I have personally reviewed following labs and imaging studies  CBC:  Recent Labs Lab 02/28/16 2159 02/29/16 0843  WBC 3.2* 2.6*  NEUTROABS 1.5*  --   HGB 4.4* 7.1*  HCT 12.1* 20.0*  MCV 114.2* 103.6*  PLT 85* 54*   Basic Metabolic Panel:  Recent Labs Lab 02/28/16 2159 02/29/16 0843  NA 131* 135  K 3.2* 4.0  CL 104 110  CO2 22 22  GLUCOSE 95 106*  BUN 19 12  CREATININE 0.79 0.69  CALCIUM 7.9* 7.6*   GFR: Estimated Creatinine Clearance: 77.7 mL/min (by C-G formula based on Cr of 0.69). Liver Function Tests:  Recent Labs Lab 02/28/16 2159 02/29/16 0843  AST 34 39  ALT 22 26  ALKPHOS 53 57  BILITOT 1.5* 1.7*  PROT 6.2* 5.8*  ALBUMIN 4.1 3.9    Recent Labs Lab 02/28/16 2159  LIPASE 31   No results for input(s): AMMONIA in the last 168 hours. Coagulation Profile: No results for input(s): INR, PROTIME in the last 168 hours. Cardiac Enzymes:  Recent Labs Lab 02/29/16 0843  TROPONINI <0.03   BNP (last 3 results) No results for input(s): PROBNP in the last 8760 hours. HbA1C: No results for input(s): HGBA1C in the last 72 hours. CBG:  Recent Labs Lab 02/29/16 0754  GLUCAP 111*   Lipid Profile: No results for input(s): CHOL, HDL, LDLCALC, TRIG, CHOLHDL, LDLDIRECT in the  last 72 hours. Thyroid Function Tests: No results for input(s): TSH, T4TOTAL, FREET4, T3FREE, THYROIDAB in the last 72 hours. Anemia Panel:  Recent Labs  02/29/16 0843  RETICCTPCT 1.8   Sepsis Labs: No results for input(s): PROCALCITON, LATICACIDVEN in the last 168 hours.  No results found for this or any previous visit (from the past 240 hour(s)).       Radiology Studies: Ct Abdomen Pelvis W Contrast  02/29/2016  CLINICAL DATA:  Acute onset of left flank pain and gross hematuria. Nausea. Initial encounter. EXAM: CT ABDOMEN AND PELVIS WITH CONTRAST TECHNIQUE: Multidetector CT imaging of the abdomen and pelvis was performed using the standard protocol following bolus administration of intravenous contrast. CONTRAST:  100mL ISOVUE-300 IOPAMIDOL (ISOVUE-300) INJECTION 61% COMPARISON:  CT of the abdomen and pelvis performed 05/24/2006 FINDINGS: Minimal bibasilar atelectasis is noted. The liver and spleen are unremarkable in appearance. The gallbladder is within normal limits. The pancreas and adrenal glands are unremarkable. Mild nonspecific perinephric stranding is noted bilaterally, more prominent on the left. There is no evidence of hydronephrosis. No renal or ureteral stones are seen. No free fluid is identified. The small bowel is unremarkable in appearance. The stomach is within normal limits. No acute vascular abnormalities are seen. The appendix is normal in caliber and contains air, without evidence of appendicitis. The colon is unremarkable in appearance. The bladder is moderately distended and grossly unremarkable. The prostate remains borderline normal in size. No inguinal lymphadenopathy is seen. No acute osseous abnormalities are identified. IMPRESSION: Mild nonspecific perinephric stranding noted bilaterally, more prominent on the left. Would correlate for any evidence of pyelonephritis. Electronically Signed   By: Roanna RaiderJeffery  Chang M.D.   On: 02/29/2016 00:49   Dg Chest Port 1  View  02/29/2016  CLINICAL DATA:  Acute onset of GI bleeding.  Initial encounter. EXAM: PORTABLE CHEST 1 VIEW COMPARISON:  None. FINDINGS: The lungs are well-aerated. Mild vascular congestion is noted. There is no evidence of focal opacification, pleural effusion or pneumothorax. The cardiomediastinal silhouette is within normal limits. No acute osseous abnormalities are seen. IMPRESSION: Mild vascular congestion noted.  Lungs remain grossly clear. Electronically Signed   By: Roanna RaiderJeffery  Chang M.D.   On: 02/29/2016 06:45        Scheduled Meds: . [START ON 03/03/2016] pantoprazole  40 mg Intravenous Q12H   Continuous Infusions: . 0.9 % NaCl with KCl 20 mEq / L 50 mL/hr at 02/29/16 0436  . pantoprozole (PROTONIX) infusion 8 mg/hr (02/29/16 0245)     LOS: 0 days        Mauricio Annett Gulaaniel Arrien, MD [facility] Pager [phone]  If 7PM-7AM, please contact night-coverage [email or web address] Password [email or web address] 02/29/2016, 10:29 AM

## 2016-02-29 NOTE — ED Provider Notes (Signed)
CSN: [id number]     Arrival date & time 02/28/16  1951 History   First MD Initiated Contact with Patient 02/28/16 2032     Chief Complaint  Patient presents with  . Abdominal Pain   HPI   Jose Compton is a 60 y.o. male PMH significant for GERD presenting with abdominal pain since this morning. He describes abdominal pain as left-sided, radiating posteriorly, constant, worsened with eating, burning, 8/10 pain scale. He states he's been diagnosed with GERD in the past and has been taking omeprazole. He denies fevers, chills, chest pain, shortness of breath nausea, vomiting, hematochezia, diarrhea or constipation. He does endorse being lethargic over the last week, increased urination with intermittent blood in his urine.  PCP: [facility]   Past Medical History  Diagnosis Date  . GERD (gastroesophageal reflux disease)    History reviewed. No pertinent past surgical history. Family History  Problem Relation Age of Onset  . Stroke Mother    Social History  Substance Use Topics  . Smoking status: Never Smoker   . Smokeless tobacco: None  . Alcohol Use: No    Review of Systems  Ten systems are reviewed and are negative for acute change except as noted in the HPI  Allergies  Review of patient's allergies indicates no known allergies.  Home Medications   Prior to Admission medications   Medication Sig Start Date End Date Taking? Authorizing Provider  omeprazole (PRILOSEC OTC) 20 MG tablet Take 20 mg by mouth daily.   Yes Historical Provider, MD  lansoprazole (PREVACID) 30 MG capsule Take 1 capsule (30 mg total) by mouth daily at 12 noon. Patient not taking: Reported on 02/28/2016 04/26/14   Carmelina DaneJeffery S Anderson, MD  sucralfate (CARAFATE) 1 G tablet 1 tab 1 hr ac and hs Patient not taking: Reported on 02/28/2016 04/26/14   Carmelina DaneJeffery S Anderson, MD   BP 101/59 mmHg  Pulse 76  Temp(Src) 98 F (36.7 C) (Oral)  Resp 14  Ht 5\' 2"  (1.575 m)  Wt 61.508 kg  BMI 24.80 kg/m2  SpO2  98% Physical Exam  Constitutional: He appears well-developed and well-nourished. No distress.  HENT:  Head: Normocephalic and atraumatic.  Mouth/Throat: Oropharynx is clear and moist. No oropharyngeal exudate.  Eyes: Conjunctivae are normal. Right eye exhibits no discharge. Left eye exhibits no discharge. No scleral icterus.  Neck: No tracheal deviation present.  Cardiovascular: Normal rate, regular rhythm, normal heart sounds and intact distal pulses.  Exam reveals no gallop and no friction rub.   No murmur heard. Pulmonary/Chest: Effort normal and breath sounds normal. No respiratory distress. He has no wheezes. He has no rales. He exhibits no tenderness.  Abdominal: Soft. Bowel sounds are normal. He exhibits no distension and no mass. There is tenderness. There is no rebound and no guarding.  Left flank tenderness  Genitourinary:  Chaperoned rectal exam: negative without mass, lesions or tenderness. Light brown stool without blood on glove  Musculoskeletal: He exhibits no edema.  Lymphadenopathy:    He has no cervical adenopathy.  Neurological: He is alert. Coordination normal.  Skin: Skin is warm and dry. No rash noted. He is not diaphoretic. No erythema. There is pallor.  Psychiatric: He has a normal mood and affect. His behavior is normal.  Nursing note and vitals reviewed.   ED Course  Procedures  Labs Review Labs Reviewed  CBC - Abnormal; Notable for the following:    WBC 3.2 (*)    RBC 1.06 (*)    Hemoglobin 4.4 (*)  HCT 12.1 (*)    MCV 114.2 (*)    MCH 41.5 (*)    MCHC 36.4 (*)    RDW 24.9 (*)    Platelets 85 (*)    All other components within normal limits  COMPREHENSIVE METABOLIC PANEL - Abnormal; Notable for the following:    Sodium 131 (*)    Potassium 3.2 (*)    Calcium 7.9 (*)    Total Protein 6.2 (*)    Total Bilirubin 1.5 (*)    All other components within normal limits  DIFFERENTIAL - Abnormal; Notable for the following:    Neutro Abs 1.5 (*)     All other components within normal limits  POC OCCULT BLOOD, ED - Abnormal; Notable for the following:    Fecal Occult Bld POSITIVE (*)    All other components within normal limits  URINALYSIS, ROUTINE W REFLEX MICROSCOPIC (NOT AT [facility])  LIPASE, BLOOD  CBC WITH DIFFERENTIAL/PLATELET  OCCULT BLOOD X 1 CARD TO LAB, STOOL  TYPE AND SCREEN  PREPARE RBC (CROSSMATCH)  ABO/RH   Imaging Review Ct Abdomen Pelvis W Contrast  02/29/2016  CLINICAL DATA:  Acute onset of left flank pain and gross hematuria. Nausea. Initial encounter. EXAM: CT ABDOMEN AND PELVIS WITH CONTRAST TECHNIQUE: Multidetector CT imaging of the abdomen and pelvis was performed using the standard protocol following bolus administration of intravenous contrast. CONTRAST:  100mL ISOVUE-300 IOPAMIDOL (ISOVUE-300) INJECTION 61% COMPARISON:  CT of the abdomen and pelvis performed 05/24/2006 FINDINGS: Minimal bibasilar atelectasis is noted. The liver and spleen are unremarkable in appearance. The gallbladder is within normal limits. The pancreas and adrenal glands are unremarkable. Mild nonspecific perinephric stranding is noted bilaterally, more prominent on the left. There is no evidence of hydronephrosis. No renal or ureteral stones are seen. No free fluid is identified. The small bowel is unremarkable in appearance. The stomach is within normal limits. No acute vascular abnormalities are seen. The appendix is normal in caliber and contains air, without evidence of appendicitis. The colon is unremarkable in appearance. The bladder is moderately distended and grossly unremarkable. The prostate remains borderline normal in size. No inguinal lymphadenopathy is seen. No acute osseous abnormalities are identified. IMPRESSION: Mild nonspecific perinephric stranding noted bilaterally, more prominent on the left. Would correlate for any evidence of pyelonephritis. Electronically Signed   By: Roanna RaiderJeffery  Chang M.D.   On: 02/29/2016 00:49   I have personally  reviewed and evaluated these images and lab results as part of my medical decision-making.  MDM   Final diagnoses:  Anemia, unspecified anemia type  Abdominal pain, unspecified abdominal location   Patient nontoxic-appearing, vital signs stable. CBC with hemoglobin of 4.4 with schistocytes and leukopenia of 3.2. Hemoccult was positive without frank blood noted on glove. Lipase, CMP, urinalysis unremarkable.  Due to hemoglobin of 4.4 and lethargy, patient was transfused in ED. 2 units ordered. Patient will need admission for symptomatic anemia and further workup. Patient in understanding and agreement with the plan.   Melton KrebsSamantha Nicole Kaiyu Mirabal, PA-C 03/13/16 [id number]  Doug SouSam Jacubowitz, MD 03/13/16 [phone]

## 2016-02-29 NOTE — ED Notes (Signed)
Nurse for floor called back, voiced concerns over pt  Floor appropriateness. Wants hospitalist to evaluate pt and be called back at ; Jose OliphantJess [phone]  Was told she already called dr.k

## 2016-02-29 NOTE — ED Notes (Signed)
zofran returned

## 2016-02-29 NOTE — ED Notes (Signed)
Pt in ct 

## 2016-02-29 NOTE — ED Notes (Signed)
hospitalist Provider in room doing assessment

## 2016-02-29 NOTE — ED Notes (Signed)
Called floor to start timer. Nurse said not approved yet will call back.

## 2016-02-29 NOTE — ED Notes (Signed)
Blood consent form signed and pt edu.

## 2016-02-29 NOTE — H&P (Signed)
History and Physical    Jose Compton FIE:[id number] DOB: [birth year] DOA: 02/28/2016  PCP: No PCP Per Patient  Patient coming from: Home.  Chief Complaint: Left flank pain and weakness.  HPI: Jose Compton is a 60 y.o. male with no significant past medical history and was self treating for possible GERD with PPI over-the-counter over the last 6 weeks presents to the ER because of left flank pain over the last 2-3 days with weakness. Weakness has been there for last 1 week. Denies any nausea vomiting black stools or blood in the stools. Denies any chest pain though patient gets heartburns at night when sleeping or eating spicy food. PPIs initially held patient's heartburn but not now. In the ER patient's hemoglobin is found to be around 4.4 with macrocytosis and pancytopenia. Stool for occult blood has been positive. Patient denies taking any NSAIDs were reported. Patient states he has had an EGD 11 years ago for GERD. Patient denies drinking alcohol. CT of the abdomen and pelvis done shows perinephric stranding more on the left side which is mild. UA is unremarkable and patient is afebrile.   ED Course: 2 units of packed red blood cell transfusion has been ordered.  Review of Systems: As per HPI, rest all negative.   Past Medical History  Diagnosis Date  . GERD (gastroesophageal reflux disease)     History reviewed. No pertinent past surgical history.   reports that he has never smoked. He does not have any smokeless tobacco history on file. He reports that he does not drink alcohol or use illicit drugs.  No Known Allergies  Family History  Problem Relation Age of Onset  . Stroke Mother     Prior to Admission medications   Medication Sig Start Date End Date Taking? Authorizing Provider  omeprazole (PRILOSEC OTC) 20 MG tablet Take 20 mg by mouth daily.   Yes Historical Provider, MD  lansoprazole (PREVACID) 30 MG capsule Take 1 capsule (30 mg total) by mouth daily at 12  noon. Patient not taking: Reported on 02/28/2016 04/26/14   Carmelina Dane, MD  sucralfate (CARAFATE) 1 G tablet 1 tab 1 hr ac and hs Patient not taking: Reported on 02/28/2016 04/26/14   Carmelina Dane, MD    Physical Exam: Filed Vitals:   02/29/16 0111 02/29/16 0115 02/29/16 0130 02/29/16 0145  BP: 98/57     Pulse: 73 75 80 71  Temp: 97.9 F (36.6 C)     TempSrc: Oral     Resp: 15 0 11 16  Height:      Weight:      SpO2: 100% 98% 100% 98%      Constitutional: Not in distress. Filed Vitals:   02/29/16 0111 02/29/16 0115 02/29/16 0130 02/29/16 0145  BP: 98/57     Pulse: 73 75 80 71  Temp: 97.9 F (36.6 C)     TempSrc: Oral     Resp: 15 0 11 16  Height:      Weight:      SpO2: 100% 98% 100% 98%   Eyes: Anicteric pallor present. ENMT: No discharge from the ears eyes nose and mouth. Neck: No mass felt. No neck rigidity. Respiratory: No rhonchi or crepitations. Cardiovascular: S1-S2 heard. Abdomen: Soft nontender bowel sounds present. No guarding or rigidity. Musculoskeletal: No edema. Skin: Appears pale. Neurologic: Alert awake oriented to time place and person. Moves all extremities. Psychiatric: Appears normal.   Labs on Admission: I have personally reviewed following labs and imaging  studies  CBC:  Recent Labs Lab 02/28/16 2159  WBC 3.2*  NEUTROABS 1.5*  HGB 4.4*  HCT 12.1*  MCV 114.2*  PLT 85*   Basic Metabolic Panel:  Recent Labs Lab 02/28/16 2159  NA 131*  K 3.2*  CL 104  CO2 22  GLUCOSE 95  BUN 19  CREATININE 0.79  CALCIUM 7.9*   GFR: Estimated Creatinine Clearance: 77.7 mL/min (by C-G formula based on Cr of 0.79). Liver Function Tests:  Recent Labs Lab 02/28/16 2159  AST 34  ALT 22  ALKPHOS 53  BILITOT 1.5*  PROT 6.2*  ALBUMIN 4.1    Recent Labs Lab 02/28/16 2159  LIPASE 31   No results for input(s): AMMONIA in the last 168 hours. Coagulation Profile: No results for input(s): INR, PROTIME in the last 168  hours. Cardiac Enzymes: No results for input(s): CKTOTAL, CKMB, CKMBINDEX, TROPONINI in the last 168 hours. BNP (last 3 results) No results for input(s): PROBNP in the last 8760 hours. HbA1C: No results for input(s): HGBA1C in the last 72 hours. CBG: No results for input(s): GLUCAP in the last 168 hours. Lipid Profile: No results for input(s): CHOL, HDL, LDLCALC, TRIG, CHOLHDL, LDLDIRECT in the last 72 hours. Thyroid Function Tests: No results for input(s): TSH, T4TOTAL, FREET4, T3FREE, THYROIDAB in the last 72 hours. Anemia Panel: No results for input(s): VITAMINB12, FOLATE, FERRITIN, TIBC, IRON, RETICCTPCT in the last 72 hours. Urine analysis:    Component Value Date/Time   COLORURINE YELLOW 02/28/2016 2032   APPEARANCEUR CLEAR 02/28/2016 2032   LABSPEC 1.020 02/28/2016 2032   PHURINE 5.5 02/28/2016 2032   GLUCOSEU NEGATIVE 02/28/2016 2032   HGBUR NEGATIVE 02/28/2016 2032   BILIRUBINUR NEGATIVE 02/28/2016 2032   KETONESUR NEGATIVE 02/28/2016 2032   PROTEINUR NEGATIVE 02/28/2016 2032   NITRITE NEGATIVE 02/28/2016 2032   LEUKOCYTESUR NEGATIVE 02/28/2016 2032   Sepsis Labs: @LABRCNTIP (procalcitonin:4,lacticidven:4) )No results found for this or any previous visit (from the past 240 hour(s)).   Radiological Exams on Admission: Ct Abdomen Pelvis W Contrast  02/29/2016  CLINICAL DATA:  Acute onset of left flank pain and gross hematuria. Nausea. Initial encounter. EXAM: CT ABDOMEN AND PELVIS WITH CONTRAST TECHNIQUE: Multidetector CT imaging of the abdomen and pelvis was performed using the standard protocol following bolus administration of intravenous contrast. CONTRAST:  100mL ISOVUE-300 IOPAMIDOL (ISOVUE-300) INJECTION 61% COMPARISON:  CT of the abdomen and pelvis performed 05/24/2006 FINDINGS: Minimal bibasilar atelectasis is noted. The liver and spleen are unremarkable in appearance. The gallbladder is within normal limits. The pancreas and adrenal glands are unremarkable. Mild  nonspecific perinephric stranding is noted bilaterally, more prominent on the left. There is no evidence of hydronephrosis. No renal or ureteral stones are seen. No free fluid is identified. The small bowel is unremarkable in appearance. The stomach is within normal limits. No acute vascular abnormalities are seen. The appendix is normal in caliber and contains air, without evidence of appendicitis. The colon is unremarkable in appearance. The bladder is moderately distended and grossly unremarkable. The prostate remains borderline normal in size. No inguinal lymphadenopathy is seen. No acute osseous abnormalities are identified. IMPRESSION: Mild nonspecific perinephric stranding noted bilaterally, more prominent on the left. Would correlate for any evidence of pyelonephritis. Electronically Signed   By: Roanna RaiderJeffery  Chang M.D.   On: 02/29/2016 00:49     Assessment/Plan Principal Problem:   Acute GI bleeding Active Problems:   Pancytopenia (HCC)   Left flank pain    1. GI bleed probably acute on chronic -  patient states he has had EGD 11 years ago I'm not able to trace his old records. Denies having any colonoscopy. At this time I have placed patient on Protonix infusion and 2 units of packed red blood cell transfusion has been ordered and may need more. Consult gastroenterologist in a.m. for now patient has been kept nothing by mouth in anticipation of procedure. 2. Pancytopenia with macrocytosis - check anemia panel specifically for B-12 deficiency. Patient may need hematology input. Patient is afebrile at this time. 3. Heartburn - will check troponin EKG chest x-ray and probably will need EGD for #1.    DVT prophylaxis: SCDs. Code Status: Full code.  Family Communication: No family at the bedside.  Disposition Plan: Home.  Consults called: None.  Admission status: Inpatient. Telemetry. Likely stay 2-3 days.    Eduard ClosKAKRAKANDY,Quentin Strebel N. MD [facility] Pager [phone].  If 7PM-7AM,  please contact night-coverage [email or web address] Password [email or web address]  02/29/2016, 2:08 AM

## 2016-03-01 DIAGNOSIS — D72819 Decreased white blood cell count, unspecified: Secondary | ICD-10-CM

## 2016-03-01 DIAGNOSIS — D539 Nutritional anemia, unspecified: Secondary | ICD-10-CM | POA: Diagnosis present

## 2016-03-01 DIAGNOSIS — R634 Abnormal weight loss: Secondary | ICD-10-CM

## 2016-03-01 DIAGNOSIS — R531 Weakness: Secondary | ICD-10-CM

## 2016-03-01 DIAGNOSIS — E538 Deficiency of other specified B group vitamins: Secondary | ICD-10-CM

## 2016-03-01 DIAGNOSIS — K922 Gastrointestinal hemorrhage, unspecified: Principal | ICD-10-CM

## 2016-03-01 DIAGNOSIS — D519 Vitamin B12 deficiency anemia, unspecified: Secondary | ICD-10-CM

## 2016-03-01 DIAGNOSIS — R109 Unspecified abdominal pain: Secondary | ICD-10-CM

## 2016-03-01 DIAGNOSIS — D531 Other megaloblastic anemias, not elsewhere classified: Secondary | ICD-10-CM

## 2016-03-01 DIAGNOSIS — K219 Gastro-esophageal reflux disease without esophagitis: Secondary | ICD-10-CM

## 2016-03-01 DIAGNOSIS — D696 Thrombocytopenia, unspecified: Secondary | ICD-10-CM

## 2016-03-01 LAB — CBC
HCT: 22.1 % — ABNORMAL LOW (ref 39.0–52.0)
HEMATOCRIT: 20 % — AB (ref 39.0–52.0)
HEMOGLOBIN: 7.1 g/dL — AB (ref 13.0–17.0)
Hemoglobin: 8 g/dL — ABNORMAL LOW (ref 13.0–17.0)
MCH: 36.8 pg — AB (ref 26.0–34.0)
MCH: 36.4 pg — AB (ref 26.0–34.0)
MCHC: 35.5 g/dL (ref 30.0–36.0)
MCHC: 36.2 g/dL — AB (ref 30.0–36.0)
MCV: 103.6 fL — AB (ref 78.0–100.0)
MCV: 100.5 fL — AB (ref 78.0–100.0)
PLATELETS: 63 10*3/uL — AB (ref 150–400)
Platelets: 54 10*3/uL — ABNORMAL LOW (ref 150–400)
RBC: 1.93 MIL/uL — AB (ref 4.22–5.81)
RBC: 2.2 MIL/uL — AB (ref 4.22–5.81)
RDW: 26.5 % — ABNORMAL HIGH (ref 11.5–15.5)
RDW: 25.8 % — ABNORMAL HIGH (ref 11.5–15.5)
WBC: 2.6 10*3/uL — ABNORMAL LOW (ref 4.0–10.5)
WBC: 3 10*3/uL — ABNORMAL LOW (ref 4.0–10.5)

## 2016-03-01 LAB — GLUCOSE, CAPILLARY
GLUCOSE-CAPILLARY: 111 mg/dL — AB (ref 65–99)
GLUCOSE-CAPILLARY: 91 mg/dL (ref 65–99)
Glucose-Capillary: 95 mg/dL (ref 65–99)

## 2016-03-01 LAB — BASIC METABOLIC PANEL
Anion gap: 5 (ref 5–15)
BUN: 10 mg/dL (ref 6–20)
CO2: 20 mmol/L — ABNORMAL LOW (ref 22–32)
CREATININE: 0.83 mg/dL (ref 0.61–1.24)
Calcium: 8 mg/dL — ABNORMAL LOW (ref 8.9–10.3)
Chloride: 115 mmol/L — ABNORMAL HIGH (ref 101–111)
GFR calc Af Amer: >60 mL/min (ref >60)
GLUCOSE: 97 mg/dL (ref 65–99)
POTASSIUM: 3.8 mmol/L (ref 3.5–5.1)
SODIUM: 140 mmol/L (ref 135–145)

## 2016-03-01 MED ORDER — SUCRALFATE 1 GM/10ML PO SUSP
1.0000 g | Freq: Three times a day (TID) | ORAL | Status: DC
  Administered 2016-03-01 – 2016-03-03 (×7): 1 g via ORAL
  Filled 2016-03-01 (×6): qty 10

## 2016-03-01 MED ORDER — PANTOPRAZOLE SODIUM 40 MG PO TBEC
40.0000 mg | DELAYED_RELEASE_TABLET | Freq: Two times a day (BID) | ORAL | Status: DC
  Administered 2016-03-01 – 2016-03-03 (×4): 40 mg via ORAL
  Filled 2016-03-01 (×4): qty 1

## 2016-03-01 MED ORDER — CYANOCOBALAMIN 1000 MCG/ML IJ SOLN
1000.0000 ug | Freq: Once | INTRAMUSCULAR | Status: DC
  Filled 2016-03-01: qty 1

## 2016-03-01 NOTE — Progress Notes (Signed)
Progress Note   Subjective   Weak and tired - hungry  Says he felt better after getting bowel cleaned out...    Objective   Vital signs in last 24 hours: Temp:  [98.5 F (36.9 C)-98.6 F (37 C)] 98.6 F (37 C) (07/06 0528) Pulse Rate:  [63-68] 63 (07/06 0528) Resp:  [16] 16 (07/05 2100) BP: (105-109)/(61-66) 105/61 mmHg (07/06 0528) SpO2:  [100 %] 100 % (07/06 0528) Last BM Date: 02/29/16 General: hispanic male in NAD Heart:  Regular rate and rhythm; no murmurs Lungs: Respirations even and unlabored, lungs CTA bilaterally Abdomen:  Soft, nontender and nondistended. Normal bowel sounds. Extremities:  Without edema. Neurologic:  Alert and oriented,  grossly normal neurologically. Psych:  Cooperative. Normal mood and affect.  Intake/Output from previous day: 07/05 0701 - 07/06 0700 In: 266 [Blood:266] Out: -  Intake/Output this shift:    Lab Results:  Recent Labs  02/28/16 2159 02/29/16 0843 02/29/16 1502 03/01/16 0126  WBC 3.2* 2.6*  --  3.0*  HGB 4.4* 7.1* 6.9* 8.0*  HCT 12.1* 20.0* 19.2* 22.1*  PLT 85* 54*  --  63*   BMET  Recent Labs  02/28/16 2159 02/29/16 0843 03/01/16 0126  NA 131* 135 140  K 3.2* 4.0 3.8  CL 104 110 115*  CO2 22 22 20*  GLUCOSE 95 106* 97  BUN 19 12 10   CREATININE 0.79 0.69 0.83  CALCIUM 7.9* 7.6* 8.0*   LFT  Recent Labs  02/29/16 0843  PROT 5.8*  ALBUMIN 3.9  AST 39  ALT 26  ALKPHOS 57  BILITOT 1.7*   PT/INR No results for input(s): LABPROT, INR in the last 72 hours.  Studies/Results: Ct Abdomen Pelvis W Contrast  02/29/2016  CLINICAL DATA:  Acute onset of left flank pain and gross hematuria. Nausea. Initial encounter. EXAM: CT ABDOMEN AND PELVIS WITH CONTRAST TECHNIQUE: Multidetector CT imaging of the abdomen and pelvis was performed using the standard protocol following bolus administration of intravenous contrast. CONTRAST:  100mL ISOVUE-300 IOPAMIDOL (ISOVUE-300) INJECTION 61% COMPARISON:  CT of the abdomen  and pelvis performed 05/24/2006 FINDINGS: Minimal bibasilar atelectasis is noted. The liver and spleen are unremarkable in appearance. The gallbladder is within normal limits. The pancreas and adrenal glands are unremarkable. Mild nonspecific perinephric stranding is noted bilaterally, more prominent on the left. There is no evidence of hydronephrosis. No renal or ureteral stones are seen. No free fluid is identified. The small bowel is unremarkable in appearance. The stomach is within normal limits. No acute vascular abnormalities are seen. The appendix is normal in caliber and contains air, without evidence of appendicitis. The colon is unremarkable in appearance. The bladder is moderately distended and grossly unremarkable. The prostate remains borderline normal in size. No inguinal lymphadenopathy is seen. No acute osseous abnormalities are identified. IMPRESSION: Mild nonspecific perinephric stranding noted bilaterally, more prominent on the left. Would correlate for any evidence of pyelonephritis. Electronically Signed   By: Roanna RaiderJeffery  Chang M.D.   On: 02/29/2016 00:49   Dg Chest Port 1 View  02/29/2016  CLINICAL DATA:  Acute onset of GI bleeding.  Initial encounter. EXAM: PORTABLE CHEST 1 VIEW COMPARISON:  None. FINDINGS: The lungs are well-aerated. Mild vascular congestion is noted. There is no evidence of focal opacification, pleural effusion or pneumothorax. The cardiomediastinal silhouette is within normal limits. No acute osseous abnormalities are seen. IMPRESSION: Mild vascular congestion noted.  Lungs remain grossly clear. Electronically Signed   By: Beryle BeamsJeffery  Chang M.D.  On: 02/29/2016 06:45       Assessment / Plan:    #1  60 yo male with significant pancytopenia, weight loss of 30 pounds  , poor appetite, and upper abdominal discomfort   As per yesterdays note Dr Marina GoodellPerry does not feel endoscopic evaluation appropriate until pancytopenia worked up- he needs an inpatient  hematology consult    Will order regular diet  GI evaluation  Pending results of bone marrow etc  Principal Problem:   Acute GI bleeding Active Problems:   Pancytopenia (HCC)   Left flank pain     LOS: 1 day   Amy Esterwood  03/01/2016, 9:03 AM  GI ATTENDING  Interval history data reviewed. Patient seen and examined. Nothing to add to yesterday's consultation note. Await complete hematology evaluation. Will sign off at this time, but available if needed for questions or problems.  Wilhemina BonitoJohn N. Eda KeysPerry, Jr., M.D. [facility] Division of Gastroenterology

## 2016-03-01 NOTE — Progress Notes (Signed)
PROGRESS NOTE    Jose Compton  ZOX:[id number] DOB: 08/19/1956 DOA: 02/28/2016 PCP: No PCP Per Patient (Confirm with patient/family/NH records and if not entered, this HAS to be entered at [facility] point of entry. "No PCP" if truly none.)   Brief Narrative: Upper GI bleed. 60 y.o male with no significant medical history presents with abdominal pain and fatigue, found to have hb of 4 and positive hemoccult testing. Vitamin b 12 deficiency.  Assessment & Plan:   Principal Problem:   Acute GI bleeding Active Problems:   Pancytopenia (HCC)   Left flank pain   Pernicious anemia   1. Cardiovascular. Blood pressure stable, no significant tachycardia. No signs if active bleeding, hb at 8.0 with hct at 22.  2. Pulmonary. Will continue to monitor oxymetry, no signs of aspiration  3, Nephrology,. Renal function stable, cr at 0.83 with K at 3,8  4. GI. Upper GI bleed, had 3 units prbc transfused, no signs of active bleeding, will change pantoprazole ot po bid and will add sucralfate for reflux and dyspepsia. Will continue to follow GI recommendations.   5. Hematology. Pancytopenia. Started on vitamin b12 suplemantation, now folate deficiency. Combined leukopenia and thrombocytopenia, all can be explained by b12 deficiency, but also noted schistocytes on smear, elevated total bilirubins. Consulted hematology for further recommendations. Non iron deficiency.  6. Patient at moderate risk for worsneing anemia.   DVT prophylaxis:  Code Status: full Family Communication: Disposition Plan: home  Consultants:  Gastroenterology  Procedures:   Antimicrobials:  Subjective: Persistent dyspepsia and reflux, improved in intensity and able to tolerate po diet, no nausea or vomiting, no hematemesis, melena or hematochezia.   Objective: Filed Vitals:   02/29/16 1825 02/29/16 1852 02/29/16 2100 03/01/16 0528  BP: 106/66 109/66 107/62 105/61  Pulse: 68 64 66 63  Temp: 98.6 F (37 C) 98.5 F (36.9  C) 98.6 F (37 C) 98.6 F (37 C)  TempSrc: Oral Oral Oral Oral  Resp: Height:      Weight:      SpO2: 100% 100% 100% 100%    Intake/Output Summary (Last 24 hours) at 03/01/16 1253 Last data filed at 02/29/16 2050  Gross per 24 hour  Intake    266 ml  Output      0 ml  Net    266 ml   Filed Weights   02/28/16 2001 02/29/16 0243  Weight: 61.508 kg (135 lb 9.6 oz) 62.4 kg (137 lb 9.1 oz)    Examination:  General exam: deconditioned  E ENT: mild conjunctival pallor, oral mucosa moist. Respiratory system: Clear to auscultation. Respiratory effort normal. No rhonchi, rales or wheezing. Cardiovascular system: S1 & S2 heard, RRR. No JVD, murmurs, rubs, gallops or clicks. No pedal edema. Gastrointestinal system: Abdomen is nondistended, soft and nontender. No organomegaly or masses felt. Normal bowel sounds heard. Central nervous system: Alert and oriented. No focal neurological deficits. Extremities: Symmetric 5 x 5 power. Skin: No rashes, lesions or ulcers Psychiatry: Judgement and insight appear normal. Mood & affect appropriate.     Data Reviewed: I have personally reviewed following labs and imaging studies  CBC:  Recent Labs Lab 02/28/16 2159 02/29/16 0843 02/29/16 1502 03/01/16 0126  WBC 3.2* 2.6*  --  3.0*  NEUTROABS 1.5*  --   --   --   HGB 4.4* 7.1* 6.9* 8.0*  HCT 12.1* 20.0* 19.2* 22.1*  MCV 114.2* 103.6*  --  100.5*  PLT 85* 54*  --  63*   Basic Metabolic Panel:  Recent Labs Lab 02/28/16 2159 02/29/16 0843 03/01/16 0126  NA 131* 135 140  K 3.2* 4.0 3.8  CL 104 110 115*  CO2 22 22 20*  GLUCOSE 95 106* 97  BUN 19 12 10   CREATININE 0.79 0.69 0.83  CALCIUM 7.9* 7.6* 8.0*   GFR: Estimated Creatinine Clearance: 74.9 mL/min (by C-G formula based on Cr of 0.83). Liver Function Tests:  Recent Labs Lab 02/28/16 2159 02/29/16 0843  AST 34 39  ALT 22 26  ALKPHOS 53 57  BILITOT 1.5* 1.7*  PROT 6.2* 5.8*  ALBUMIN 4.1 3.9    Recent  Labs Lab 02/28/16 2159  LIPASE 31   No results for input(s): AMMONIA in the last 168 hours. Coagulation Profile: No results for input(s): INR, PROTIME in the last 168 hours. Cardiac Enzymes:  Recent Labs Lab 02/29/16 0843  TROPONINI <0.03   BNP (last 3 results) No results for input(s): PROBNP in the last 8760 hours. HbA1C: No results for input(s): HGBA1C in the last 72 hours. CBG:  Recent Labs Lab 02/29/16 0754 02/29/16 1721 03/01/16 0022 03/01/16 0739  GLUCAP 111* 122* 91 95   Lipid Profile: No results for input(s): CHOL, HDL, LDLCALC, TRIG, CHOLHDL, LDLDIRECT in the last 72 hours. Thyroid Function Tests: No results for input(s): TSH, T4TOTAL, FREET4, T3FREE, THYROIDAB in the last 72 hours. Anemia Panel:  Recent Labs  02/29/16 0843  VITAMINB12 27*  FOLATE 20.9  FERRITIN 336  TIBC 228*  IRON 196*  RETICCTPCT 1.8   Sepsis Labs: No results for input(s): PROCALCITON, LATICACIDVEN in the last 168 hours.  No results found for this or any previous visit (from the past 240 hour(s)).       Radiology Studies: Ct Abdomen Pelvis W Contrast  02/29/2016  CLINICAL DATA:  Acute onset of left flank pain and gross hematuria. Nausea. Initial encounter. EXAM: CT ABDOMEN AND PELVIS WITH CONTRAST TECHNIQUE: Multidetector CT imaging of the abdomen and pelvis was performed using the standard protocol following bolus administration of intravenous contrast. CONTRAST:  100mL ISOVUE-300 IOPAMIDOL (ISOVUE-300) INJECTION 61% COMPARISON:  CT of the abdomen and pelvis performed 05/24/2006 FINDINGS: Minimal bibasilar atelectasis is noted. The liver and spleen are unremarkable in appearance. The gallbladder is within normal limits. The pancreas and adrenal glands are unremarkable. Mild nonspecific perinephric stranding is noted bilaterally, more prominent on the left. There is no evidence of hydronephrosis. No renal or ureteral stones are seen. No free fluid is identified. The small bowel is  unremarkable in appearance. The stomach is within normal limits. No acute vascular abnormalities are seen. The appendix is normal in caliber and contains air, without evidence of appendicitis. The colon is unremarkable in appearance. The bladder is moderately distended and grossly unremarkable. The prostate remains borderline normal in size. No inguinal lymphadenopathy is seen. No acute osseous abnormalities are identified. IMPRESSION: Mild nonspecific perinephric stranding noted bilaterally, more prominent on the left. Would correlate for any evidence of pyelonephritis. Electronically Signed   By: Roanna RaiderJeffery  Chang M.D.   On: 02/29/2016 00:49   Dg Chest Port 1 View  02/29/2016  CLINICAL DATA:  Acute onset of GI bleeding.  Initial encounter. EXAM: PORTABLE CHEST 1 VIEW COMPARISON:  None. FINDINGS: The lungs are well-aerated. Mild vascular congestion is noted. There is no evidence of focal opacification, pleural effusion or pneumothorax. The cardiomediastinal silhouette is within normal limits. No acute osseous abnormalities are seen. IMPRESSION: Mild vascular congestion noted.  Lungs remain grossly clear. Electronically Signed  By: Roanna RaiderJeffery  Chang M.D.   On: 02/29/2016 06:45        Scheduled Meds: . cyanocobalamin  1,000 mcg Intramuscular Once  . [START ON 03/03/2016] pantoprazole  40 mg Intravenous Q12H   Continuous Infusions: . pantoprozole (PROTONIX) infusion 8 mg/hr (03/01/16 0959)     LOS: 1 day        Tamico Mundo Annett Gulaaniel Nikitas Davtyan, MD [facility] Pager [phone]  If 7PM-7AM, please contact night-coverage [email or web address] Password [email or web address] 03/01/2016, 12:53 PM

## 2016-03-01 NOTE — Consult Note (Signed)
[place]  [phone]   [place]  DOB: 05/28/1956  MR#: [id number]  CSN#: [id number]    Requesting Physician: [facility]  Patient Care Team: No Pcp Per Patient as PCP - General (General Practice)  Reason for consult: Pancytopenia  History of present illness: Mr. Ciancio is a 60 year old male, originally from [place], was admitted to [facility] yesterday for severe anemia. I was called to see the patient for pancytopenia.  He has had GERD for a few years, and takes over-the-counter medications such as Tums, no other significant past medical history. He does not see doctor regularly. He was last here 2 year ago for chest pain, and a CBC was normal, except elevated MCV at 108.1. He has been feeling fatigue, dizzy, for the past week, and came to the emergency room the night before yesterday after work, for worsening symptoms and the pre-syncope. CBC in the ED showed WBC 3.2, hemoglobin 4.4, platelet 85,000, MCV 114. Differential showed increased bands (>20%). CMP was unremarkable except a mild hypokalemia, and total bilirubin 1.5.  He has received 3u RBC, feels much better. He denies any chest pain, peripheral neuropathy or other neurological symptoms. He has mild intermittent headache in the past few days, and palpitation. He has lost about 40lbs in the past 6 week, for which he contributes to the acid reflux and eating less. His bowel movement is normal or slightly constipated. He denies melena, only had 1 episode of hematochezia 6 months ago. No other signs of bleeding.  He is widowed, has been the [place] for 18 years, he has 2 children in [place]. He works for a [employer], systems, his job is physically demanding. He denies any history of liver disease, prior transfusion, he drinks alcohol occasionally. He is a never smoker. No family history of malignancy or blood disorders.  MEDICAL HISTORY:  Past  Medical History  Diagnosis Date  . GERD (gastroesophageal reflux disease)     SURGICAL HISTORY: History reviewed. No pertinent past surgical history.  SOCIAL HISTORY: Social History   Social History  . Marital Status: Widowed    Spouse Name: N/A  . Number of Children: N/A  . Years of Education: N/A   Occupational History  . Not on file.   Social History Main Topics  . Smoking status: Never Smoker   . Smokeless tobacco: Not on file  . Alcohol Use: No  . Drug Use: No  . Sexual Activity: Not on file   Other Topics Concern  . Not on file   Social History Narrative    FAMILY HISTORY: Family History  Problem Relation Age of Onset  . Stroke Mother     ALLERGIES:  has No Known Allergies.  MEDICATIONS:  Current Facility-Administered Medications  Medication Dose Route Frequency Provider Last Rate Last Dose  . acetaminophen (TYLENOL) tablet 650 mg  650 mg Oral Q6H PRN Rise Patience, MD   650 mg at 03/01/16 3976   Or  . acetaminophen (TYLENOL) suppository 650 mg  650 mg Rectal Q6H PRN Rise Patience, MD      . cyanocobalamin ((VITAMIN B-12)) injection 1,000 mcg  1,000 mcg Intramuscular Once Tawni Millers, MD      . morphine 2 MG/ML injection 1 mg  1 mg Intravenous Q4H PRN Tawni Millers, MD      . ondansetron [facility]) tablet 4 mg  4 mg Oral Q6H PRN Rise Patience, MD  Or  . ondansetron (ZOFRAN) injection 4 mg  4 mg Intravenous Q6H PRN Rise Patience, MD      . pantoprazole (PROTONIX) EC tablet 40 mg  40 mg Oral BID Tawni Millers, MD      . sucralfate (CARAFATE) 1 GM/10ML suspension 1 g  1 g Oral TID WC & HS Mauricio Gerome Apley, MD        REVIEW OF SYSTEMS:   Constitutional: Denies fevers, chills or abnormal night sweats, (+) weight loss  Eyes: Denies blurriness of vision, double vision or watery eyes Ears, nose, mouth, throat, and face: Denies mucositis or sore throat Respiratory: Denies cough, dyspnea or  wheezes Cardiovascular: Denies palpitation, chest discomfort or lower extremity swelling Gastrointestinal:  Denies nausea, heartburn or change in bowel habits Skin: Denies abnormal skin rashes Lymphatics: Denies new lymphadenopathy or easy bruising Neurological:Denies numbness, tingling or new weaknesses Behavioral/Psych: Mood is stable, no new changes  All other systems were reviewed with the patient and are negative.  PHYSICAL EXAMINATION: ECOG PERFORMANCE STATUS: 1 - Symptomatic but completely ambulatory  Filed Vitals:   03/01/16 0528 03/01/16 1500  BP: 105/61 104/66  Pulse: 63 66  Temp: 98.6 F (37 C) 98.7 F (37.1 C)  Resp:  16   Filed Weights   02/28/16 2001 02/29/16 0243  Weight: 135 lb 9.6 oz (61.508 kg) 137 lb 9.1 oz (62.4 kg)    GENERAL:alert, no distress and comfortable SKIN: skin color, texture, turgor are normal, no rashes or significant lesions EYES: normal, conjunctiva are pink and non-injected, sclera clear OROPHARYNX:no exudate, no erythema and lips, buccal mucosa, and tongue normal  NECK: supple, thyroid normal size, non-tender, without nodularity LYMPH:  no palpable lymphadenopathy in the cervical, axillary or inguinal LUNGS: clear to auscultation and percussion with normal breathing effort HEART: regular rate & rhythm and no murmurs and no lower extremity edema ABDOMEN:abdomen soft, non-tender and normal bowel sounds Musculoskeletal:no cyanosis of digits and no clubbing  PSYCH: alert & oriented x 3 with fluent speech NEURO: no focal motor/sensory deficits  LABORATORY DATA:  I have reviewed the data as listed Lab Results  Component Value Date   WBC 3.0* 03/01/2016   HGB 8.0* 03/01/2016   HCT 22.1* 03/01/2016   MCV 100.5* 03/01/2016   PLT 63* 03/01/2016    Recent Labs  02/28/16 2159 02/29/16 0843 03/01/16 0126  NA 131* 135 140  K 3.2* 4.0 3.8  CL 104 110 115*  CO2 22 22 20*  GLUCOSE 95 106* 97  BUN '19 12 10  '$ CREATININE 0.79 0.69 0.83   CALCIUM 7.9* 7.6* 8.0*  GFRNONAA >60 >60 >60  GFRAA >60 >60 >60  PROT 6.2* 5.8*  --   ALBUMIN 4.1 3.9  --   AST 34 39  --   ALT 22 26  --   ALKPHOS 53 57  --   BILITOT 1.5* 1.7*  --     RADIOGRAPHIC STUDIES: I have personally reviewed the radiological images as listed and agreed with the findings in the report. Ct Abdomen Pelvis W Contrast  02/29/2016  CLINICAL DATA:  Acute onset of left flank pain and gross hematuria. Nausea. Initial encounter. EXAM: CT ABDOMEN AND PELVIS WITH CONTRAST TECHNIQUE: Multidetector CT imaging of the abdomen and pelvis was performed using the standard protocol following bolus administration of intravenous contrast. CONTRAST:  167m ISOVUE-300 IOPAMIDOL (ISOVUE-300) INJECTION 61% COMPARISON:  CT of the abdomen and pelvis performed 05/24/2006 FINDINGS: Minimal bibasilar atelectasis is noted. The liver and spleen are  unremarkable in appearance. The gallbladder is within normal limits. The pancreas and adrenal glands are unremarkable. Mild nonspecific perinephric stranding is noted bilaterally, more prominent on the left. There is no evidence of hydronephrosis. No renal or ureteral stones are seen. No free fluid is identified. The small bowel is unremarkable in appearance. The stomach is within normal limits. No acute vascular abnormalities are seen. The appendix is normal in caliber and contains air, without evidence of appendicitis. The colon is unremarkable in appearance. The bladder is moderately distended and grossly unremarkable. The prostate remains borderline normal in size. No inguinal lymphadenopathy is seen. No acute osseous abnormalities are identified. IMPRESSION: Mild nonspecific perinephric stranding noted bilaterally, more prominent on the left. Would correlate for any evidence of pyelonephritis. Electronically Signed   By: Garald Balding M.D.   On: 02/29/2016 00:49   Dg Chest Port 1 View  02/29/2016  CLINICAL DATA:  Acute onset of GI bleeding.  Initial  encounter. EXAM: PORTABLE CHEST 1 VIEW COMPARISON:  None. FINDINGS: The lungs are well-aerated. Mild vascular congestion is noted. There is no evidence of focal opacification, pleural effusion or pneumothorax. The cardiomediastinal silhouette is within normal limits. No acute osseous abnormalities are seen. IMPRESSION: Mild vascular congestion noted.  Lungs remain grossly clear. Electronically Signed   By: Garald Balding M.D.   On: 02/29/2016 06:45    ASSESSMENT & PLAN:  60 year old male, with PMH of GERD, otherwise healthy and fit, origin from [place], presented with symptomatic anemia.  1. Severe macrocytic anemia 2. Mild leukopenia and moderate thrombocytopenia 3. Vit B12 deficiency  4. GERD  Recommendations: -He has macrocytic, low-productive anemia, and lab test showed low VitB level (27). This is consistent with megaloblastic anemia, which can cause pancytopenia also. -I reviewed his peripheral blood smear, which showed significant polychromasia, macrocytosis. I will review the slides with pathologist Dr. Gari Crown also tomorrow  -I will take the liberty to order intransic factor to rule out pernicious anemia  -other etiology of macrocytic anemia includes hypothyroidism, liver disease, and a bone marrow disease. He has no history of liver disease, abdominal CT showed normal-appearing liver and spleen. I will check TSH also  -I recommend him to start B12 injection, 1012mg IM every week X4, then month, please start tonight or tomorrow  -if he does not respond to B12 injection in 3 weeks, I will consider a bone marrow biopsy to ruled out bone marrow disease, such as MDS. -I will follow up tomorrow. If he is doing well after blood transfusion, OK to discharge, and he will follow up with me in my office.    All questions were answered. The patient knows to call the clinic with any problems, questions or concerns.      FTruitt Merle MD 03/01/2016 4:30 PM

## 2016-03-01 NOTE — Clinical Documentation Improvement (Signed)
Internal Medicine Gastroenterology  Can the diagnosis of anemia be further specified?  Thank you     Due to/in/with neoplastic disease    Macrocytic anemia    Microcytic anemia    Nutritional anemia, including the nutrition or mineral deficits  Anemia of chronic disease, including the associated chronic disease state  Acute on chronic blood loss anemia  Other  Clinically Undetermined  Document any associated diagnoses/conditions. Gastrointestinal Bleed   Supporting Information:gross hematuria, H/H 4.4/7.1  Treatment : Transfusion of PRBC   Please exercise your independent, professional judgment when responding. A specific answer is not anticipated or expected.   Thank You,  Lavonda JumboLawanda J Daruis Swaim [employer] [facility] [place] [phone]

## 2016-03-02 DIAGNOSIS — D649 Anemia, unspecified: Secondary | ICD-10-CM

## 2016-03-02 LAB — CBC WITH DIFFERENTIAL/PLATELET
BASOS ABS: 0 10*3/uL (ref 0.0–0.1)
Basophils Relative: 0 %
Eosinophils Absolute: 0.2 10*3/uL (ref 0.0–0.7)
Eosinophils Relative: 6 %
HEMATOCRIT: 21.2 % — AB (ref 39.0–52.0)
Hemoglobin: 7.6 g/dL — ABNORMAL LOW (ref 13.0–17.0)
LYMPHS ABS: 1 10*3/uL (ref 0.7–4.0)
LYMPHS PCT: 29 %
MCH: 36 pg — AB (ref 26.0–34.0)
MCHC: 35.8 g/dL (ref 30.0–36.0)
MCV: 100.5 fL — AB (ref 78.0–100.0)
MONO ABS: 0.1 10*3/uL (ref 0.1–1.0)
Monocytes Relative: 3 %
NEUTROS ABS: 2.2 10*3/uL (ref 1.7–7.7)
Neutrophils Relative %: 62 %
Platelets: 60 10*3/uL — ABNORMAL LOW (ref 150–400)
RBC: 2.11 MIL/uL — AB (ref 4.22–5.81)
RDW: 25.3 % — AB (ref 11.5–15.5)
WBC: 3.5 10*3/uL — ABNORMAL LOW (ref 4.0–10.5)

## 2016-03-02 LAB — FOLATE RBC
FOLATE, HEMOLYSATE: 284.1 ng/mL
Folate, RBC: 1291 ng/mL (ref >498)
HEMATOCRIT: 22 % — AB (ref 37.5–51.0)

## 2016-03-02 LAB — GLUCOSE, CAPILLARY
GLUCOSE-CAPILLARY: 93 mg/dL (ref 65–99)
Glucose-Capillary: 100 mg/dL — ABNORMAL HIGH (ref 65–99)
Glucose-Capillary: 101 mg/dL — ABNORMAL HIGH (ref 65–99)
Glucose-Capillary: 105 mg/dL — ABNORMAL HIGH (ref 65–99)

## 2016-03-02 LAB — PREPARE RBC (CROSSMATCH)

## 2016-03-02 LAB — RETICULOCYTES
RBC.: 2.12 MIL/uL — AB (ref 4.22–5.81)
RETIC CT PCT: 0.7 % (ref 0.4–3.1)
Retic Count, Absolute: 14.8 10*3/uL — ABNORMAL LOW (ref 19.0–186.0)

## 2016-03-02 LAB — HIV ANTIBODY (ROUTINE TESTING W REFLEX): HIV SCREEN 4TH GENERATION: NONREACTIVE

## 2016-03-02 LAB — TSH: TSH: 1.033 u[IU]/mL (ref 0.350–4.500)

## 2016-03-02 MED ORDER — SODIUM CHLORIDE 0.9 % IV SOLN
Freq: Once | INTRAVENOUS | Status: AC
  Administered 2016-03-02: 18:00:00 via INTRAVENOUS

## 2016-03-02 NOTE — Progress Notes (Signed)
PROGRESS NOTE    Jose Compton  ZOX:[id number] DOB: 12/22/1955 DOA: 02/28/2016 PCP: No PCP Per Patient   Brief Narrative: Upper GI bleed. 60 y.o male with no significant medical history presents with abdominal pain and fatigue, found to have hb of 4 and positive hemoccult testing. Vitamin b 12 deficiency.  Assessment & Plan:   Principal Problem:   Acute GI bleeding Active Problems:   Pancytopenia (HCC)   Left flank pain   Deficiency anemia   B12 deficiency anemia   Thrombocytopenia (HCC)  1. Cardiovascular. Blood pressure stable, no significant tachycardia. No signs if active bleeding, hb down to  7,6  2. Pulmonary. Will continue to monitor oxymetry, no signs of aspiration  3, Nephrology. Patient tolerating po well, no chem profile for today.   4. GI. Upper GI bleed, No signs of active bleeding, will continue antiacid therapy with pantoprazole and sucralfate. PO as tolerated. No nausea or vomiting. No planned endoscopy on this admission.   5. Hematology. Pancytopenia. Started on vitamin b12 suplemantation, now folate deficiency. Combined leukopenia and thrombocytopenia. Follow on intrinsic factor to r/o pernicious anemia. Patient with hb down to 7,6 with persistent anemia symptoms, (fatigue and generalized weakness) Will transfuse 1 unit prbc ( total so far this admsion 4)  Patient at moderate risk for worsneing anemia.    DVT prophylaxis:  Code Status:  full Family Communication:  Disposition Plan:   Consultants:   Hematology  GI  Procedures:   Antimicrobials:   Subjective: Patient feeling weak and easy fatigue, no chest pain or significant dyspnea, positive back pain, no nausea or vomiting.   Objective: Filed Vitals:   03/01/16 0528 03/01/16 1500 03/01/16 2154 03/02/16 0540  BP: 105/61 104/66 102/54 100/60  Pulse: 63 66 69 65  Temp: 98.6 F (37 C) 98.7 F (37.1 C) 99 F (37.2 C) 99.2 F (37.3 C)  TempSrc: Oral Oral Oral Oral  Resp:  16 16 18   Height:       Weight:      SpO2: 100% 99% 98% 98%    Intake/Output Summary (Last 24 hours) at 03/02/16 1409 Last data filed at 03/02/16 0419  Gross per 24 hour  Intake    630 ml  Output    500 ml  Net    130 ml   Filed Weights   02/28/16 2001 02/29/16 0243  Weight: 61.508 kg (135 lb 9.6 oz) 62.4 kg (137 lb 9.1 oz)    Examination:  General exam: Patient deconditioned.  E ENT: conjunctival pallor, oral mucosa moist. Respiratory system: . Respiratory effort normal. No wheezing, rales or rhonchi. Cardiovascular system: S1 & S2 heard, RRR. No JVD, murmurs, rubs, gallops or clicks. No pedal edema. Gastrointestinal system: Abdomen is nondistended, soft and nontender. No organomegaly or masses felt. Normal bowel sounds heard. Central nervous system: Alert and oriented. No focal neurological deficits. Extremities: Symmetric 5 x 5 power. Skin: No rashes, lesions or ulcers   Data Reviewed: I have personally reviewed following labs and imaging studies  CBC:  Recent Labs Lab 02/28/16 2159 02/29/16 0843 02/29/16 1502 03/01/16 0126 03/02/16 0411  WBC 3.2* 2.6*  --  3.0* 3.5*  NEUTROABS 1.5*  --   --   --  2.2  HGB 4.4* 7.1* 6.9* 8.0* 7.6*  HCT 12.1* 20.0* 19.2* 22.1* 21.2*  MCV 114.2* 103.6*  --  100.5* 100.5*  PLT 85* 54*  --  63* 60*   Basic Metabolic Panel:  Recent Labs Lab 02/28/16 2159 02/29/16 0843 03/01/16 0126  NA 131* 135 140  K 3.2* 4.0 3.8  CL 104 110 115*  CO2 22 22 20*  GLUCOSE 95 106* 97  BUN 19 12 10   CREATININE 0.79 0.69 0.83  CALCIUM 7.9* 7.6* 8.0*   GFR: Estimated Creatinine Clearance: 74.9 mL/min (by C-G formula based on Cr of 0.83). Liver Function Tests:  Recent Labs Lab 02/28/16 2159 02/29/16 0843  AST 34 39  ALT 22 26  ALKPHOS 53 57  BILITOT 1.5* 1.7*  PROT 6.2* 5.8*  ALBUMIN 4.1 3.9    Recent Labs Lab 02/28/16 2159  LIPASE 31   No results for input(s): AMMONIA in the last 168 hours. Coagulation Profile: No results for input(s): INR,  PROTIME in the last 168 hours. Cardiac Enzymes:  Recent Labs Lab 02/29/16 0843  TROPONINI <0.03   BNP (last 3 results) No results for input(s): PROBNP in the last 8760 hours. HbA1C: No results for input(s): HGBA1C in the last 72 hours. CBG:  Recent Labs Lab 03/01/16 0022 03/01/16 0739 03/01/16 1609 03/02/16 0003 03/02/16 0736  GLUCAP 91 95 111* 101* 93   Lipid Profile: No results for input(s): CHOL, HDL, LDLCALC, TRIG, CHOLHDL, LDLDIRECT in the last 72 hours. Thyroid Function Tests:  Recent Labs  03/02/16 0411  TSH 1.033   Anemia Panel:  Recent Labs  02/29/16 0843 03/02/16 0411  VITAMINB12 27*  --   FOLATE 20.9  --   FERRITIN 336  --   TIBC 228*  --   IRON 196*  --   RETICCTPCT 1.8 0.7   Sepsis Labs: No results for input(s): PROCALCITON, LATICACIDVEN in the last 168 hours.  No results found for this or any previous visit (from the past 240 hour(s)).       Radiology Studies: No results found.      Scheduled Meds: . sodium chloride   Intravenous Once  . cyanocobalamin  1,000 mcg Intramuscular Once  . pantoprazole  40 mg Oral BID  . sucralfate  1 g Oral TID WC & HS   Continuous Infusions:    LOS: 2 days        Tahiri Shareef Annett Gulaaniel Uma Jerde, MD [facility] Pager [phone]  If 7PM-7AM, please contact night-coverage [email or web address] Password [email or web address] 03/02/2016, 2:09 PM

## 2016-03-03 ENCOUNTER — Other Ambulatory Visit: Payer: Self-pay | Admitting: Hematology

## 2016-03-03 DIAGNOSIS — D519 Vitamin B12 deficiency anemia, unspecified: Secondary | ICD-10-CM

## 2016-03-03 LAB — CBC WITH DIFFERENTIAL/PLATELET
BASOS PCT: 0 %
Basophils Absolute: 0 10*3/uL (ref 0.0–0.1)
EOS ABS: 0.2 10*3/uL (ref 0.0–0.7)
EOS PCT: 6 %
HCT: 24.7 % — ABNORMAL LOW (ref 39.0–52.0)
HEMOGLOBIN: 8.8 g/dL — AB (ref 13.0–17.0)
Lymphocytes Relative: 32 %
Lymphs Abs: 1 10*3/uL (ref 0.7–4.0)
MCH: 34.8 pg — AB (ref 26.0–34.0)
MCHC: 35.6 g/dL (ref 30.0–36.0)
MCV: 97.6 fL (ref 78.0–100.0)
MONOS PCT: 3 %
Monocytes Absolute: 0.1 10*3/uL (ref 0.1–1.0)
NEUTROS PCT: 59 %
Neutro Abs: 1.8 10*3/uL (ref 1.7–7.7)
PLATELETS: 41 10*3/uL — AB (ref 150–400)
RBC: 2.53 MIL/uL — ABNORMAL LOW (ref 4.22–5.81)
RDW: 23 % — AB (ref 11.5–15.5)
WBC: 3.1 10*3/uL — ABNORMAL LOW (ref 4.0–10.5)

## 2016-03-03 LAB — BASIC METABOLIC PANEL
Anion gap: 4 — ABNORMAL LOW (ref 5–15)
BUN: 12 mg/dL (ref 6–20)
CALCIUM: 8 mg/dL — AB (ref 8.9–10.3)
CHLORIDE: 109 mmol/L (ref 101–111)
CO2: 24 mmol/L (ref 22–32)
CREATININE: 0.69 mg/dL (ref 0.61–1.24)
Glucose, Bld: 97 mg/dL (ref 65–99)
Potassium: 3.7 mmol/L (ref 3.5–5.1)
SODIUM: 137 mmol/L (ref 135–145)

## 2016-03-03 LAB — GLUCOSE, CAPILLARY: GLUCOSE-CAPILLARY: 105 mg/dL — AB (ref 65–99)

## 2016-03-03 MED ORDER — SUCRALFATE 1 GM/10ML PO SUSP: 1.0000 g | Freq: Three times a day (TID) | ORAL | Status: DC

## 2016-03-03 MED ORDER — PANTOPRAZOLE SODIUM 40 MG PO TBEC: 40.0000 mg | DELAYED_RELEASE_TABLET | Freq: Two times a day (BID) | ORAL | Status: DC

## 2016-03-03 MED ORDER — ACETAMINOPHEN 500 MG PO TABS: 500.0000 mg | ORAL_TABLET | Freq: Four times a day (QID) | ORAL | Status: AC | PRN

## 2016-03-03 MED ORDER — CYANOCOBALAMIN 1000 MCG/ML IJ SOLN: 1000.0000 ug | INTRAMUSCULAR | Status: AC

## 2016-03-03 NOTE — Discharge Summary (Signed)
Jose LoraFrancisco Compton, is a 60 y.o. male  DOB 07/05/1956  MRN [id number].  Admission date:  02/28/2016  Admitting Physician  Eduard ClosArshad N Kakrakandy, MD  Discharge Date:  03/03/2016   Primary MD  No PCP Per Patient  Recommendations for primary care physician for things to follow:   Patient is discharge home with instructions to follow up with the Hematology Clinic on Thursday for B12 injections. Patient will continue antiacid therapy   Admission Diagnosis  Abdominal pain, unspecified abdominal location [R10.9] Anemia, unspecified anemia type [D64.9]   Discharge Diagnosis  Abdominal pain, unspecified abdominal location [R10.9] Anemia, unspecified anemia type [D64.9]    Principal Problem:   Acute GI bleeding Active Problems:   Pancytopenia (HCC)   Left flank pain   Deficiency anemia   B12 deficiency anemia   Thrombocytopenia (HCC)      Past Medical History  Diagnosis Date  . GERD (gastroesophageal reflux disease)     History reviewed. No pertinent past surgical history.     HPI  from the history and physical done on the day of admission:    This is a 60 year old gentleman who presents to the hospital with the chief complaint of left flank pain and weakness. Patient had been suffering from dyspepsia for about 6 weeks prior to admission. About one week prior to admission he presented generalized weakness and for last 2-3 days prior to hospitalization he felt left flank pain. On initial physical examination patient was pale, his blood pressure was 98/57, heart 73, respiratory rate 15, oxygen saturation 100%, temperature 97.9. His oral mucosa was moist, his neck supple, his lungs were clear to auscultation bilaterally, heart S1-S2 present rhythmic, his abdomen was soft and nontender: Extremities had no edema. His initial hemoglobin was 4.4 with hematocrit 12.1, white count 3.2, platelet count 85. CT of the  abdomen and pelvis showed mild nonspecific perinephric stranding noted bilaterally more prominent on the left, his urine analysis was negative for infection, his serum sodium was 131, potassium 3.2, creatinine 0.79 with a BUN of 19, his chest film was negative for infiltrates.  Patient was admitted to hospital working diagnosis of symptomatically anemia to rule out acute blood loss anemia due to upper GI bleed.     Hospital Course:    1. Cardiovascular. Patient remained hemodynamically stable. Patient received 4 units of packed red blood cells with no signs of volume overload. No signs of systemic infection.  2. Pulmonary. Patient had oximetry monitor and supplemental oxygen per nasal cannula as needed. No signs of volume overload.  3. Nephrology. Patient's kidney function remained stable.  4. Gastrointestinal. Possible upper GI bleed. Patient was placed on proton pump inhibitors intravenously, patient was seen by gastroenterology service. Considering patient's megaloblastic anemia and no signs of iron deficiency, further workup was referred to hematology. No endoscopy was performed on this admission. Note that patient tested positive for fecal occult blood. Dyspepsia. Patient will be discharged on antiacid therapy including pantoprazole and sucralfate.   5. Hematology. B12 deficiency megaloblastic anemia. Patient's MCV  was 114.2, iron studies showed high iron level, TIBC low and high transferrin saturation. He had a very low vitamin B-12 down to 27, his folic acid was normal. Hematology was consulted, is presumed that his anemia is due to vitamin B12, patient was started on vitamin B12  Intramuscular injections, he will follow-up with hematology next week for further B12 injections. Intrinsic factor was ordered result pending. Patient received total of 4 units of packed red blood cells for his profound symptomatic anemia.  Discharge Condition: Table  Follow UP  Follow-up Information     Follow up with Malachy Mood, MD In 5 days.   Specialties:  Hematology, Oncology   Why:  Thursday at 10:45am (lab 10:15am)  for follow up and B12 injection    Contact information:   [address] [place] [address] [phone]        Consults obtained - Hematology and gastroenterology  Diet and Activity recommendation: See Discharge Instructions below  Discharge Instructions    Discharge Instructions    Diet - low sodium heart healthy    Complete by:  As directed      Discharge instructions    Complete by:  As directed   Please follow with Hematology Clinic on Thursday at 10:45am (lab 10:15am) for follow up and B12 injection      Increase activity slowly    Complete by:  As directed              Discharge Medications       Medication List    STOP taking these medications        lansoprazole 30 MG capsule  Commonly known as:  PREVACID     omeprazole 20 MG tablet  Commonly known as:  PRILOSEC OTC     sucralfate 1 g tablet  Commonly known as:  CARAFATE  Replaced by:  sucralfate 1 GM/10ML suspension      TAKE these medications        acetaminophen 500 MG tablet  Commonly known as:  TYLENOL  Take 1 tablet (500 mg total) by mouth every 6 (six) hours as needed for headache.     cyanocobalamin 1000 MCG/ML injection  Commonly known as:  (VITAMIN B-12)  Inject 1 mL (1,000 mcg total) into the muscle every 7 (seven) days.     pantoprazole 40 MG tablet  Commonly known as:  PROTONIX  Take 1 tablet (40 mg total) by mouth 2 (two) times daily.     sucralfate 1 GM/10ML suspension  Commonly known as:  CARAFATE  Take 10 mLs (1 g total) by mouth 4 (four) times daily -  with meals and at bedtime.        Major procedures and Radiology Reports - PLEASE review detailed and final reports for all details, in brief -     Ct Abdomen Pelvis W Contrast  02/29/2016  CLINICAL DATA:  Acute onset of left flank pain and gross hematuria. Nausea. Initial encounter. EXAM: CT  ABDOMEN AND PELVIS WITH CONTRAST TECHNIQUE: Multidetector CT imaging of the abdomen and pelvis was performed using the standard protocol following bolus administration of intravenous contrast. CONTRAST:  ISOVUE-300 IOPAMIDOL (ISOVUE-300) INJECTION 61% COMPARISON:  CT of the abdomen and pelvis performed 05/24/2006 FINDINGS: Minimal bibasilar atelectasis is noted. The liver and spleen are unremarkable in appearance. The gallbladder is within normal limits. The pancreas and adrenal glands are unremarkable. Mild nonspecific perinephric stranding is noted bilaterally, more prominent on the left. There is no evidence of  hydronephrosis. No renal or ureteral stones are seen. No free fluid is identified. The small bowel is unremarkable in appearance. The stomach is within normal limits. No acute vascular abnormalities are seen. The appendix is normal in caliber and contains air, without evidence of appendicitis. The colon is unremarkable in appearance. The bladder is moderately distended and grossly unremarkable. The prostate remains borderline normal in size. No inguinal lymphadenopathy is seen. No acute osseous abnormalities are identified. IMPRESSION: Mild nonspecific perinephric stranding noted bilaterally, more prominent on the left. Would correlate for any evidence of pyelonephritis. Electronically Signed   By: Roanna Raider M.D.   On: 02/29/2016 00:49   Dg Chest Port 1 View  02/29/2016  CLINICAL DATA:  Acute onset of GI bleeding.  Initial encounter. EXAM: PORTABLE CHEST 1 VIEW COMPARISON:  None. FINDINGS: The lungs are well-aerated. Mild vascular congestion is noted. There is no evidence of focal opacification, pleural effusion or pneumothorax. The cardiomediastinal silhouette is within normal limits. No acute osseous abnormalities are seen. IMPRESSION: Mild vascular congestion noted.  Lungs remain grossly clear. Electronically Signed   By: Roanna Raider M.D.   On: 02/29/2016 06:45    Micro Results      No results found for this or any previous visit (from the past 240 hour(s)).     Today   Subjective    Jose Compton , Patient is feeling better, denies any hematemesis or hematochezia denies any melena. Patient is tolerating by mouth diet adequately. He does have a headache and mild dyspepsia.    Objective   Blood pressure 121/65, pulse 65, temperature 98.3 F (36.8 C), temperature source Oral, resp. rate 20, height  (1.575 m), weight 62.4 kg (137 lb 9.1 oz), SpO2 98 %.   Intake/Output Summary (Last 24 hours) at 03/03/16 1238 Last data filed at 03/03/16 0827  Gross per 24 hour  Intake    580 ml  Output      0 ml  Net    580 ml    Exam  General awake and alert Conjunctivae mild paleness oral mucosa moist Neck supple Chest. Lungs clear to auscultation bilaterally, no wheezing rales or rhonchi, heart S1-S2 present rhythmic no gallops, rubs or murmurs. Abdomen soft nontender nondistended Extremities no edema Neuro exam nonfocal    Data Review   CBC w Diff: Lab Results  Component Value Date   WBC 3.1* 03/03/2016   WBC 7.0 04/26/2014   HGB 8.8* 03/03/2016   HGB 14.4 04/26/2014   HCT 24.7* 03/03/2016   HCT 22.0* 03/01/2016   HCT 45.0 04/26/2014   PLT 41* 03/03/2016   LYMPHOPCT 32 03/03/2016   MONOPCT 3 03/03/2016   EOSPCT 6 03/03/2016   BASOPCT 0 03/03/2016    CMP: Lab Results  Component Value Date   NA 137 03/03/2016   K 3.7 03/03/2016   CL 109 03/03/2016   CO2 24 03/03/2016   BUN 12 03/03/2016   CREATININE 0.69 03/03/2016   CREATININE 0.68 04/26/2014   PROT 5.8* 02/29/2016   ALBUMIN 3.9 02/29/2016   BILITOT 1.7* 02/29/2016   ALKPHOS 57 02/29/2016   AST 39 02/29/2016   ALT 26 02/29/2016  .   Total Time in preparing paper work, data evaluation and todays exam - 45 minutes  Coralie Keens M.D on 03/03/2016 at 12:38 PM  [facility]   Office  [phone]

## 2016-03-03 NOTE — Progress Notes (Signed)
Jose Compton   DOB:1956/02/16   DG#:[id number]   RJJ#:[id number]  Hematology follow up  Subjective: Jose Compton received 1 more unit of blood yesterday for hemoglobin 7.6 yesterday, improved to 8.8 this morning. Bili count slightly dropped today, 41,000, no clinical signs of bleeding. He feels better after blood transfusion, no other new complaints   Objective:  Filed Vitals:   03/02/16 2039 03/03/16 0520  BP: 120/75 121/65  Pulse: 75 65  Temp: 98.4 F (36.9 C) 98.3 F (36.8 C)  Resp: 20 20    Body mass index is 25.15 kg/(m^2).  Intake/Output Summary (Last 24 hours) at 03/03/16 1009 Last data filed at 03/03/16 0827  Gross per 24 hour  Intake    580 ml  Output      0 ml  Net    580 ml     Sclerae unicteric  Oropharynx clear  No peripheral adenopathy  Lungs clear -- no rales or rhonchi  Heart regular rate and rhythm  Abdomen benign  MSK no focal spinal tenderness, no peripheral edema  Neuro nonfocal  Skin: No petechia or ecchymosis  CBG (last 3)   Recent Labs  03/02/16 1639 03/02/16 2303 03/03/16 0720  GLUCAP 105* 100* 105*     Labs:  Lab Results  Component Value Date   WBC 3.1* 03/03/2016   HGB 8.8* 03/03/2016   HCT 24.7* 03/03/2016   MCV 97.6 03/03/2016   PLT 41* 03/03/2016   NEUTROABS 1.8 03/03/2016    '@LASTCHEMISTRY'$ @  Urine Studies No results for input(s): UHGB, CRYS in the last 72 hours.  Invalid input(s): UACOL, UAPR, USPG, UPH, UTP, UGL, UKET, UBIL, UNIT, UROB, [place], UEPI, UWBC, Duwayne Heck [place], [place]  Basic Metabolic Panel:  Recent Labs Lab 02/28/16 2159 02/29/16 0843 03/01/16 0126 03/03/16 0402  NA 131* 135 140 137  K 3.2* 4.0 3.8 3.7  CL 104 110 115* 109  CO2 22 22 20* 24  GLUCOSE 95 106* 97 97  BUN '19 12 10 12  '$ CREATININE 0.79 0.69 0.83 0.69  CALCIUM 7.9* 7.6* 8.0* 8.0*   GFR Estimated Creatinine Clearance: 77.7 mL/min (by C-G formula based on Cr of 0.69). Liver Function Tests:  Recent Labs Lab 02/28/16 2159  02/29/16 0843  AST 34 39  ALT 22 26  ALKPHOS 53 57  BILITOT 1.5* 1.7*  PROT 6.2* 5.8*  ALBUMIN 4.1 3.9    Recent Labs Lab 02/28/16 2159  LIPASE 31   No results for input(s): AMMONIA in the last 168 hours. Coagulation profile No results for input(s): INR, PROTIME in the last 168 hours.  CBC:  Recent Labs Lab 02/28/16 2159 02/29/16 0843 02/29/16 1502 03/01/16 0126 03/02/16 0411 03/03/16 0402  WBC 3.2* 2.6*  --  3.0* 3.5* 3.1*  NEUTROABS 1.5*  --   --   --  2.2 1.8  HGB 4.4* 7.1* 6.9* 8.0* 7.6* 8.8*  HCT 12.1* 20.0* 19.2* 22.1*  22.0* 21.2* 24.7*  MCV 114.2* 103.6*  --  100.5* 100.5* 97.6  PLT 85* 54*  --  63* 60* 41*   Cardiac Enzymes:  Recent Labs Lab 02/29/16 0843  TROPONINI <0.03   BNP: Invalid input(s): POCBNP CBG:  Recent Labs Lab 03/02/16 0003 03/02/16 0736 03/02/16 1639 03/02/16 2303 03/03/16 0720  GLUCAP 101* 93 105* 100* 105*   D-Dimer No results for input(s): DDIMER in the last 72 hours. Hgb A1c No results for input(s): HGBA1C in the last 72 hours. Lipid Profile No results for input(s): CHOL, HDL, LDLCALC, TRIG, CHOLHDL,  LDLDIRECT in the last 72 hours. Thyroid function studies  Recent Labs  03/02/16 0411  TSH 1.033   Anemia work up  Recent Labs  03/02/16 0411  RETICCTPCT 0.7   Microbiology No results found for this or any previous visit (from the past 240 hour(s)).    Studies:  No results found.  Assessment: Jose Compton, with PMH of GERD, otherwise healthy and fit, origin from [place], presented with symptomatic anemia.  1. Severe macrocytic anemia 2. Mild leukopenia and moderate thrombocytopenia 3. Vit B12 deficiency  4. GERD   Plan: -I have reviewed his peripheral smear with pathologist Dr. Oletta Darter yesterday, no significant dyspoiesis, significant polychromasia, macrocytosis. No significant hypersegmentation, atypical cells or blasts, low ret and platelet. The morphology is not concern for MDS or leukemia.   -continue B12 injection weekly  -OK to discharge from hematology standpoint, I will see him back on Thursday at 10:45am (lab 10:15am) in m clinic for follow up and B12 injection  -I discussed with Dr. Kathrin Penner, MD 03/03/2016  10:09 AM

## 2016-03-05 ENCOUNTER — Inpatient Hospital Stay: Payer: BLUE CROSS/BLUE SHIELD | Admitting: Hematology

## 2016-03-05 ENCOUNTER — Telehealth: Payer: Self-pay | Admitting: Hematology

## 2016-03-05 ENCOUNTER — Telehealth: Payer: Self-pay | Admitting: *Deleted

## 2016-03-05 LAB — INTRINSIC FACTOR ANTIBODIES: Intrinsic Factor: 0.9 AU/mL (ref 0.0–1.1)

## 2016-03-05 LAB — TYPE AND SCREEN
ABO/RH(D): O POS
ANTIBODY SCREEN: NEGATIVE
UNIT DIVISION: 0
Unit division: 0
Unit division: 0
Unit division: 0

## 2016-03-05 NOTE — Telephone Encounter (Signed)
[name], please call in carafate tab for him tomorrow and let him know. Thanks  Jose Compton

## 2016-03-05 NOTE — Telephone Encounter (Signed)
TC from patient stating he is having problems getting his Carafate filled.  He states he needs insurance authorization for this (it is ordered as as a suspension).  He is also asking if he can take it in tablet form.  If so he would need a new prescription.  Also he would like to [facility] [place].

## 2016-03-05 NOTE — Telephone Encounter (Signed)
added B12 per Dr Mosetta PuttFeng

## 2016-03-06 ENCOUNTER — Other Ambulatory Visit: Payer: Self-pay | Admitting: *Deleted

## 2016-03-06 MED ORDER — SUCRALFATE 1 G PO TABS: 1.0000 g | ORAL_TABLET | Freq: Three times a day (TID) | ORAL | Status: DC

## 2016-03-08 ENCOUNTER — Ambulatory Visit (HOSPITAL_BASED_OUTPATIENT_CLINIC_OR_DEPARTMENT_OTHER): Payer: BLUE CROSS/BLUE SHIELD

## 2016-03-08 ENCOUNTER — Ambulatory Visit (HOSPITAL_BASED_OUTPATIENT_CLINIC_OR_DEPARTMENT_OTHER): Payer: BLUE CROSS/BLUE SHIELD | Admitting: Hematology

## 2016-03-08 ENCOUNTER — Telehealth: Payer: Self-pay | Admitting: Hematology

## 2016-03-08 ENCOUNTER — Encounter: Payer: Self-pay | Admitting: Hematology

## 2016-03-08 ENCOUNTER — Other Ambulatory Visit (HOSPITAL_BASED_OUTPATIENT_CLINIC_OR_DEPARTMENT_OTHER): Payer: BLUE CROSS/BLUE SHIELD

## 2016-03-08 VITALS — BP 111/63 | HR 79 | Temp 97.8°F | Resp 18 | Ht 62.0 in | Wt 134.3 lb

## 2016-03-08 DIAGNOSIS — D531 Other megaloblastic anemias, not elsewhere classified: Secondary | ICD-10-CM | POA: Diagnosis not present

## 2016-03-08 DIAGNOSIS — D696 Thrombocytopenia, unspecified: Secondary | ICD-10-CM | POA: Diagnosis not present

## 2016-03-08 DIAGNOSIS — E538 Deficiency of other specified B group vitamins: Secondary | ICD-10-CM

## 2016-03-08 DIAGNOSIS — D519 Vitamin B12 deficiency anemia, unspecified: Secondary | ICD-10-CM

## 2016-03-08 DIAGNOSIS — D649 Anemia, unspecified: Secondary | ICD-10-CM

## 2016-03-08 DIAGNOSIS — D61818 Other pancytopenia: Secondary | ICD-10-CM | POA: Diagnosis not present

## 2016-03-08 LAB — COMPREHENSIVE METABOLIC PANEL
ALBUMIN: 4.5 g/dL (ref 3.5–5.0)
ALK PHOS: 91 U/L (ref 40–150)
ALT: 30 U/L (ref 0–55)
AST: 32 U/L (ref 5–34)
Anion Gap: 9 mEq/L (ref 3–11)
BUN: 16.9 mg/dL (ref 7.0–26.0)
CALCIUM: 9.3 mg/dL (ref 8.4–10.4)
CO2: 25 mEq/L (ref 22–29)
CREATININE: 0.8 mg/dL (ref 0.7–1.3)
Chloride: 105 mEq/L (ref 98–109)
EGFR: >90 mL/min/{1.73_m2} (ref >90)
GLUCOSE: 112 mg/dL (ref 70–140)
POTASSIUM: 3.9 meq/L (ref 3.5–5.1)
SODIUM: 138 meq/L (ref 136–145)
TOTAL PROTEIN: 7.5 g/dL (ref 6.4–8.3)
Total Bilirubin: 0.88 mg/dL (ref 0.20–1.20)

## 2016-03-08 LAB — CBC & DIFF AND RETIC
BASO%: 0.8 % (ref 0.0–2.0)
BASOS ABS: 0 10*3/uL (ref 0.0–0.1)
EOS ABS: 0.2 10*3/uL (ref 0.0–0.5)
EOS%: 6 % (ref 0.0–7.0)
HCT: 27.7 % — ABNORMAL LOW (ref 38.4–49.9)
HEMOGLOBIN: 9.4 g/dL — AB (ref 13.0–17.1)
IMMATURE RETIC FRACT: 3.8 % (ref 3.00–10.60)
LYMPH#: 1 10*3/uL (ref 0.9–3.3)
LYMPH%: 38.1 % (ref 14.0–49.0)
MCH: 33.8 pg — AB (ref 27.2–33.4)
MCHC: 33.9 g/dL (ref 32.0–36.0)
MCV: 99.6 fL — ABNORMAL HIGH (ref 79.3–98.0)
MONO#: 0.1 10*3/uL (ref 0.1–0.9)
MONO%: 5.6 % (ref 0.0–14.0)
NEUT%: 49.5 % (ref 39.0–75.0)
NEUTROS ABS: 1.3 10*3/uL — AB (ref 1.5–6.5)
Platelets: 47 10*3/uL — ABNORMAL LOW (ref 140–400)
RBC: 2.78 10*6/uL — ABNORMAL LOW (ref 4.20–5.82)
RDW: 21.7 % — AB (ref 11.0–14.6)
RETIC %: 0.74 % — AB (ref 0.80–1.80)
RETIC CT ABS: 20.57 10*3/uL — AB (ref 34.80–93.90)
WBC: 2.5 10*3/uL — ABNORMAL LOW (ref 4.0–10.3)

## 2016-03-08 LAB — LACTATE DEHYDROGENASE: LDH: 657 U/L — ABNORMAL HIGH (ref 125–245)

## 2016-03-08 LAB — TECHNOLOGIST REVIEW

## 2016-03-08 MED ORDER — CYANOCOBALAMIN 1000 MCG/ML IJ SOLN
1000.0000 ug | Freq: Once | INTRAMUSCULAR | Status: AC
  Administered 2016-03-08: 1000 ug via INTRAMUSCULAR

## 2016-03-08 NOTE — Telephone Encounter (Signed)
per pof to sch pt appt-gave pt copy of avs °

## 2016-03-08 NOTE — Progress Notes (Signed)
[place]  [phone] [phone]  Clinic Follow up Note   Patient Care Team: No Pcp Per Patient as PCP - General (General Practice) 03/08/2016   CHIEF COMPLAIN: hospital discharge follow-up  History of present illness (03/01/2016): Jose Compton is a 60 year old male, originally from [place], was admitted to [facility] yesterday for severe anemia. I was called to see the patient for pancytopenia.  He has had GERD for a few years, and takes over-the-counter medications such as Tums, no other significant past medical history. He does not see doctor regularly. He was last here 2 year ago for chest pain, and a CBC was normal, except elevated MCV at 108.1. He has been feeling fatigue, dizzy, for the past week, and came to the emergency room the night before yesterday after work, for worsening symptoms and the pre-syncope. CBC in the ED showed WBC 3.2, hemoglobin 4.4, platelet 85,000, MCV 114. Differential showed increased bands (>20%). CMP was unremarkable except a mild hypokalemia, and total bilirubin 1.5.  He has received 3u RBC, feels much better. He denies any chest pain, peripheral neuropathy or other neurological symptoms. He has mild intermittent headache in the past few days, and palpitation. He has lost about 40lbs in the past 6 week, for which he contributes to the acid reflux and eating less. His bowel movement is normal or slightly constipated. He denies melena, only had 1 episode of hematochezia 6 months ago. No other signs of bleeding.  He is widowed, has been the [place] for 18 years, he has 2 children in [place]. He works for a [employer], systems, his job is physically demanding. He denies any history of liver disease, prior transfusion, he drinks alcohol occasionally. He is a never smoker. No family history of malignancy or blood disorders.  CURRENT THERAPY: B12 1070mg IM weekly started on 03/01/2016  INTERVAL HISTORY: Jose MBraymanreturns for follow up  after his recent hospital discharge. He feels better overall, has more energy since hospital discharge, he complains of dysphagia with solid food, feels food stucked in the middle of chest in the past few day, no problem with liquid. No other complains, no fever or bleeding.   REVIEW OF SYSTEMS:   Constitutional: Denies fevers, chills or abnormal weight loss Eyes: Denies blurriness of vision Ears, nose, mouth, throat, and face: Denies mucositis or sore throat Respiratory: Denies cough, dyspnea or wheezes Cardiovascular: Denies palpitation, chest discomfort or lower extremity swelling Gastrointestinal:  Denies nausea, heartburn or change in bowel habits Skin: Denies abnormal skin rashes Lymphatics: Denies new lymphadenopathy or easy bruising Neurological:Denies numbness, tingling or new weaknesses Behavioral/Psych: Mood is stable, no new changes  All other systems were reviewed with the patient and are negative.  MEDICAL HISTORY:  Past Medical History  Diagnosis Date  . GERD (gastroesophageal reflux disease)     SURGICAL HISTORY: No past surgical history on file.  I have reviewed the social history and family history with the patient and they are unchanged from previous note.  ALLERGIES:  has No Known Allergies.  MEDICATIONS:  Current Outpatient Prescriptions  Medication Sig Dispense Refill  . acetaminophen (TYLENOL) 500 MG tablet Take 1 tablet (500 mg total) by mouth every 6 (six) hours as needed for headache. 30 tablet 0  . cyanocobalamin (,VITAMIN B-12,) 1000 MCG/ML injection Inject 1 mL (1,000 mcg total) into the muscle every 7 (seven) days. 4 mL 0  . pantoprazole (PROTONIX) 40 MG tablet Take 1 tablet (40 mg total) by mouth 2 (two)  times daily. 60 tablet 0  . sucralfate (CARAFATE) 1 g tablet Take 1 tablet (1 g total) by mouth 4 (four) times daily -  with meals and at bedtime. 90 tablet 1   No current facility-administered medications for this visit.    PHYSICAL  EXAMINATION: ECOG PERFORMANCE STATUS: 1 - Symptomatic but completely ambulatory  There were no vitals filed for this visit. There were no vitals filed for this visit.  GENERAL:alert, no distress and comfortable SKIN: skin color, texture, turgor are normal, no rashes or significant lesions EYES: normal, Conjunctiva are pink and non-injected, sclera clear OROPHARYNX:no exudate, no erythema and lips, buccal mucosa, and tongue normal  NECK: supple, thyroid normal size, non-tender, without nodularity LYMPH:  no palpable lymphadenopathy in the cervical, axillary or inguinal LUNGS: clear to auscultation and percussion with normal breathing effort HEART: regular rate & rhythm and no murmurs and no lower extremity edema ABDOMEN:abdomen soft, non-tender and normal bowel sounds Musculoskeletal:no cyanosis of digits and no clubbing  NEURO: alert & oriented x 3 with fluent speech, no focal motor/sensory deficits  LABORATORY DATA:  I have reviewed the data as listed CBC Latest Ref Rng 03/03/2016 03/02/2016 03/01/2016  WBC 4.0 - 10.5 K/uL 3.1(L) 3.5(L) 3.0(L)  Hemoglobin 13.0 - 17.0 g/dL 8.8(L) 7.6(L) 8.0(L)  Hematocrit 39.0 - 52.0 % 24.7(L) 21.2(L) 22.1(L)  Platelets 150 - 400 K/uL 41(L) 60(L) 63(L)     CMP Latest Ref Rng 03/03/2016 03/01/2016 02/29/2016  Glucose 65 - 99 mg/dL 97 97 106(H)  BUN 6 - 20 mg/dL '12 10 12  '$ Creatinine 0.61 - 1.24 mg/dL 0.69 0.83 0.69  Sodium 135 - 145 mmol/L 137 140 135  Potassium 3.5 - 5.1 mmol/L 3.7 3.8 4.0  Chloride 101 - 111 mmol/L 109 115(H) 110  CO2 22 - 32 mmol/L 24 20(L) 22  Calcium 8.9 - 10.3 mg/dL 8.0(L) 8.0(L) 7.6(L)  Total Protein 6.5 - 8.1 g/dL - - 5.8(L)  Total Bilirubin 0.3 - 1.2 mg/dL - - 1.7(H)  Alkaline Phos 38 - 126 U/L - - 57  AST 15 - 41 U/L - - 39  ALT 17 - 63 U/L - - 26      RADIOGRAPHIC STUDIES: I have personally reviewed the radiological images as listed and agreed with the findings in the report. No results found.   ASSESSMENT & PLAN:  60 year old male, with PMH of GERD, otherwise healthy and fit, origin from [place], presented with symptomatic anemia.  1. Severe macrocytic anemia 2. Mild leukopenia and moderate thrombocytopenia 3. Vit B12 deficiency  4. GERD  Plan -His anemia has improved after blood transfusion and B12 injection, however his leukopenia and thrombocytopenia have not improved much -Will continue B12 injection weekly, for a total of 4-6 weeks, then monthly.  -Intrinsic factor antibody negative, unlikely penicia anemia  -If her cytopenia does not improved in 2-3 weeks, I will get a bone marrow biopsy to rule out bone marrow disorders such as MDS or lymphoma  -his perpheral smear was reviewed and unremarkable, CT abdomen and pelvis did not show adenopathy  -neutropenic fever precaution discussed with him  -I will see him back in 2 weeks   All questions were answered. The patient knows to call the clinic with any problems, questions or concerns. No barriers to learning was detected. I spent 20 minutes counseling the patient face to face. The total time spent in the appointment was 30 minutes and more than 50% was on counseling and review of test results  Truitt Merle, MD 03/08/2016

## 2016-03-08 NOTE — Patient Instructions (Signed)

## 2016-03-15 ENCOUNTER — Ambulatory Visit: Payer: BLUE CROSS/BLUE SHIELD | Admitting: Nutrition

## 2016-03-15 ENCOUNTER — Ambulatory Visit (HOSPITAL_BASED_OUTPATIENT_CLINIC_OR_DEPARTMENT_OTHER): Payer: BLUE CROSS/BLUE SHIELD

## 2016-03-15 ENCOUNTER — Ambulatory Visit: Payer: BLUE CROSS/BLUE SHIELD

## 2016-03-15 VITALS — BP 115/55 | HR 75 | Temp 98.6°F | Resp 20

## 2016-03-15 DIAGNOSIS — E538 Deficiency of other specified B group vitamins: Secondary | ICD-10-CM | POA: Diagnosis not present

## 2016-03-15 DIAGNOSIS — D519 Vitamin B12 deficiency anemia, unspecified: Secondary | ICD-10-CM

## 2016-03-15 MED ORDER — CYANOCOBALAMIN 1000 MCG/ML IJ SOLN
1000.0000 ug | Freq: Once | INTRAMUSCULAR | Status: AC
  Administered 2016-03-15: 1000 ug via INTRAMUSCULAR

## 2016-03-15 NOTE — Patient Instructions (Signed)

## 2016-03-15 NOTE — Progress Notes (Signed)
60 year old male diagnosed with anemia.  He is a patient of Dr. Mosetta PuttFeng.  Past medical history includes GERD and vitamin B12 deficiency.  Medications include vitamin B12, Protonix, and Carafate.  Labs include albumin 4.5.  Height: 62 inches. Weight: 134.3 pounds. Usual body weight: 150 pounds in August 2015. BMI: 24.56.  Patient reports he did have a history of solid food dysphasia however swallowing has now improved.   Patient requests diet education on "bland" diet secondary to GERD. Patient denies other nutrition impact symptoms.  Nutrition diagnosis: Food and nutrition related deficit related to anemia and GERD as evidenced by no prior need for nutrition related information.  Intervention: Patient educated on foods to avoid on diet for GERD. Recommended patient chew food well and to consume small meals and snacks. Encouraged patient to stay upright after eating and avoid caffeine and pepper as well as other spicy foods as tolerated. Educated patient on foods to improve anemia. Patient was given fact sheets.  Questions were answered.  Teach back method used.  Patient was provided with my contact information.  Monitoring, evaluation, goals: Patient will tolerate diet for improved quality-of-life.  No follow-up required.  Nutrition diagnosis resolved.  **Disclaimer: This note was dictated with voice recognition software. Similar sounding words can inadvertently be transcribed and this note may contain transcription errors which may not have been corrected upon publication of note.**

## 2016-03-22 ENCOUNTER — Ambulatory Visit (HOSPITAL_BASED_OUTPATIENT_CLINIC_OR_DEPARTMENT_OTHER): Payer: BLUE CROSS/BLUE SHIELD

## 2016-03-22 ENCOUNTER — Telehealth: Payer: Self-pay | Admitting: Hematology

## 2016-03-22 ENCOUNTER — Encounter: Payer: Self-pay | Admitting: Hematology

## 2016-03-22 ENCOUNTER — Ambulatory Visit (HOSPITAL_BASED_OUTPATIENT_CLINIC_OR_DEPARTMENT_OTHER): Payer: BLUE CROSS/BLUE SHIELD | Admitting: Hematology

## 2016-03-22 ENCOUNTER — Other Ambulatory Visit (HOSPITAL_BASED_OUTPATIENT_CLINIC_OR_DEPARTMENT_OTHER): Payer: BLUE CROSS/BLUE SHIELD

## 2016-03-22 VITALS — BP 124/68 | HR 75 | Temp 98.3°F | Resp 18 | Ht 62.0 in | Wt 137.3 lb

## 2016-03-22 DIAGNOSIS — D519 Vitamin B12 deficiency anemia, unspecified: Secondary | ICD-10-CM

## 2016-03-22 DIAGNOSIS — E538 Deficiency of other specified B group vitamins: Secondary | ICD-10-CM

## 2016-03-22 DIAGNOSIS — D649 Anemia, unspecified: Secondary | ICD-10-CM | POA: Diagnosis not present

## 2016-03-22 DIAGNOSIS — D696 Thrombocytopenia, unspecified: Secondary | ICD-10-CM | POA: Diagnosis not present

## 2016-03-22 LAB — CBC & DIFF AND RETIC
BASO%: 1.3 % (ref 0.0–2.0)
BASOS ABS: 0.1 10*3/uL (ref 0.0–0.1)
EOS%: 3.9 % (ref 0.0–7.0)
Eosinophils Absolute: 0.3 10*3/uL (ref 0.0–0.5)
HEMATOCRIT: 32.8 % — AB (ref 38.4–49.9)
HGB: 10.8 g/dL — ABNORMAL LOW (ref 13.0–17.1)
IMMATURE RETIC FRACT: 6.6 % (ref 3.00–10.60)
LYMPH#: 1.1 10*3/uL (ref 0.9–3.3)
LYMPH%: 17.3 % (ref 14.0–49.0)
MCH: 33.5 pg — ABNORMAL HIGH (ref 27.2–33.4)
MCHC: 32.9 g/dL (ref 32.0–36.0)
MCV: 101.9 fL — AB (ref 79.3–98.0)
MONO#: 0.8 10*3/uL (ref 0.1–0.9)
MONO%: 13.1 % (ref 0.0–14.0)
NEUT#: 4.1 10*3/uL (ref 1.5–6.5)
NEUT%: 64.4 % (ref 39.0–75.0)
PLATELETS: 394 10*3/uL (ref 140–400)
RBC: 3.22 10*6/uL — ABNORMAL LOW (ref 4.20–5.82)
RDW: 18.8 % — ABNORMAL HIGH (ref 11.0–14.6)
RETIC CT ABS: 99.5 10*3/uL — AB (ref 34.80–93.90)
Retic %: 3.09 % — ABNORMAL HIGH (ref 0.80–1.80)
WBC: 6.4 10*3/uL (ref 4.0–10.3)

## 2016-03-22 MED ORDER — CYANOCOBALAMIN 1000 MCG/ML IJ SOLN
1000.0000 ug | Freq: Once | INTRAMUSCULAR | Status: AC
  Administered 2016-03-22: 1000 ug via INTRAMUSCULAR

## 2016-03-22 NOTE — Patient Instructions (Signed)

## 2016-03-22 NOTE — Progress Notes (Signed)
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  History of present illness (03/01/2016): Jose Compton is a 60 year old male, originally from [place], was admitted to [facility] yesterday for severe anemia. I was called to see the patient for pancytopenia.  He has had GERD for a few years, and takes over-the-counter medications such as Tums, no other significant past medical history. He does not see doctor regularly. He was last here 2 year ago for chest pain, and a CBC was normal, except elevated MCV at 108.1. He has been feeling fatigue, dizzy, for the past week, and came to the emergency room the night before yesterday after work, for worsening symptoms and the pre-syncope. CBC in the ED showed WBC 3.2, hemoglobin 4.4, platelet 85,000, MCV 114. Differential showed increased bands (>20%). CMP was unremarkable except a mild hypokalemia, and total bilirubin 1.5.  He has received 3u RBC, feels much better. He denies any chest pain, peripheral neuropathy or other neurological symptoms. He has mild intermittent headache in the past few days, and palpitation. He has lost about 40lbs in the past 6 week, for which he contributes to the acid reflux and eating less. His bowel movement is normal or slightly constipated. He denies melena, only had 1 episode of hematochezia 6 months ago. No other signs of bleeding.  He is widowed, has been the [place] for 18 years, he has 2 children in [place]. He works for a [employer], systems, his job is physically demanding. He denies any history of liver disease, prior transfusion, he drinks alcohol occasionally. He is a never smoker. No family history of malignancy or blood disorders.  CURRENT THERAPY: B12 1026mg IM weekly started on 03/01/2016  INTERVAL HISTORY: Jose MSherereturns for  follow up. He has received a B12 injection for the past 3 weeks, tolerating well. He overall feels better, with more energy. He goes to work most of the day and helps out, but does not do heavy duty activity. He occasionally has abdominal discomfort after a big meal, no nausea or constipation. His previous headache has most resolved. He has no other complaints.  REVIEW OF SYSTEMS:   Constitutional: Denies fevers, chills or abnormal weight loss Eyes: Denies blurriness of vision Ears, nose, mouth, throat, and face: Denies mucositis or sore throat Respiratory: Denies cough, dyspnea or wheezes Cardiovascular: Denies palpitation, chest discomfort or lower extremity swelling Gastrointestinal:  Denies nausea, heartburn or change in bowel habits Skin: Denies abnormal skin rashes Lymphatics: Denies new lymphadenopathy or easy bruising Neurological:Denies numbness, tingling or new weaknesses Behavioral/Psych: Mood is stable, no new changes  All other systems were reviewed with the patient and are negative.  MEDICAL HISTORY:  Past Medical History:  Diagnosis Date  . GERD (gastroesophageal reflux disease)     SURGICAL HISTORY: History reviewed. No pertinent surgical history.  I have reviewed the social history and family history with the patient and they are unchanged from previous note.  ALLERGIES:  has No Known Allergies.  MEDICATIONS:  Current Outpatient Prescriptions  Medication Sig Dispense Refill  . acetaminophen (TYLENOL) 500 MG tablet Take 1 tablet (500 mg total) by mouth every 6 (six) hours as needed for headache. 30 tablet 0  . cyanocobalamin (,VITAMIN B-12,) 1000 MCG/ML injection Inject 1 mL (1,000 mcg total) into the muscle every 7 (seven) days. 4 mL  0  . pantoprazole (PROTONIX) 40 MG tablet Take 1 tablet (40 mg total) by mouth 2 (two) times daily. 60 tablet 0  . sucralfate (CARAFATE) 1 g tablet Take 1 tablet (1 g total) by mouth 4 (four) times daily -  with meals and at bedtime. 90  tablet 1   No current facility-administered medications for this visit.     PHYSICAL EXAMINATION: ECOG PERFORMANCE STATUS: 1 - Symptomatic but completely ambulatory  Vitals:   03/22/16 1103  BP: 124/68  Pulse: 75  Resp: 18  Temp: 98.3 F (36.8 C)   Filed Weights   03/22/16 1103  Weight: 137 lb 4.8 oz (62.3 kg)    GENERAL:alert, no distress and comfortable SKIN: skin color, texture, turgor are normal, no rashes or significant lesions EYES: normal, Conjunctiva are pink and non-injected, sclera clear OROPHARYNX:no exudate, no erythema and lips, buccal mucosa, and tongue normal  NECK: supple, thyroid normal size, non-tender, without nodularity LYMPH:  no palpable lymphadenopathy in the cervical, axillary or inguinal LUNGS: clear to auscultation and percussion with normal breathing effort HEART: regular rate & rhythm and no murmurs and no lower extremity edema ABDOMEN:abdomen soft, non-tender and normal bowel sounds Musculoskeletal:no cyanosis of digits and no clubbing  NEURO: alert & oriented x 3 with fluent speech, no focal motor/sensory deficits  LABORATORY DATA:  I have reviewed the data as listed CBC Latest Ref Rng & Units 03/22/2016 03/08/2016 03/03/2016  WBC 4.0 - 10.3 10e3/uL 6.4 2.5(L) 3.1(L)  Hemoglobin 13.0 - 17.1 g/dL 10.8(L) 9.4(L) 8.8(L)  Hematocrit 38.4 - 49.9 % 32.8(L) 27.7(L) 24.7(L)  Platelets 140 - 400 10e3/uL 394 47(L) 41(L)     CMP Latest Ref Rng & Units 03/08/2016 03/03/2016 03/01/2016  Glucose 70 - 140 mg/dl 112 97 97  BUN 7.0 - 26.0 mg/dL 16.'9 12 10  '$ Creatinine 0.7 - 1.3 mg/dL 0.8 0.69 0.83  Sodium 136 - 145 mEq/L 138 137 140  Potassium 3.5 - 5.1 mEq/L 3.9 3.7 3.8  Chloride 101 - 111 mmol/L - 109 115(H)  CO2 22 - 29 mEq/L 25 24 20(L)  Calcium 8.4 - 10.4 mg/dL 9.3 8.0(L) 8.0(L)  Total Protein 6.4 - 8.3 g/dL 7.5 - -  Total Bilirubin 0.20 - 1.20 mg/dL 0.88 - -  Alkaline Phos 40 - 150 U/L 91 - -  AST 5 - 34 U/L 32 - -  ALT 0 - 55 U/L 30 - -       RADIOGRAPHIC STUDIES: I have personally reviewed the radiological images as listed and agreed with the findings in the report. No results found.   ASSESSMENT & PLAN: 60 year old male, with PMH of GERD, otherwise healthy and fit, origin from [place], presented with symptomatic anemia.  1. Severe macrocytic anemia, much improved  2. Mild leukopenia and moderate thrombocytopenia, resolved now  3. Vit B12 deficiency  4. GERD  Plan -His anemia has much improved, hemoglobin 10.8 today. -His leukopenia and thrombocytopenia has resolved also, indicating good response to B12 injection. -I do not think he needs a bone marrow biopsy, since the cytopenias responded well -Continue B12 injection weekly for total of 6 weeks, then changed to monthly -I'll see him back in 6 weeks.  All questions were answered. The patient knows to call the clinic with any problems, questions or concerns. No barriers to learning was detected.  I spent 15 minutes counseling the patient face to face. The total time spent in the appointment was 20 minutes and more than 50% was on counseling and review  of test results     Truitt Merle, MD 03/22/16

## 2016-03-22 NOTE — Telephone Encounter (Signed)
per pof to sch pt appt-gave pt copy of avs °

## 2016-03-29 ENCOUNTER — Ambulatory Visit (HOSPITAL_BASED_OUTPATIENT_CLINIC_OR_DEPARTMENT_OTHER): Payer: BLUE CROSS/BLUE SHIELD

## 2016-03-29 VITALS — BP 106/66 | HR 70 | Temp 98.4°F | Resp 17

## 2016-03-29 DIAGNOSIS — E538 Deficiency of other specified B group vitamins: Secondary | ICD-10-CM | POA: Diagnosis not present

## 2016-03-29 DIAGNOSIS — D519 Vitamin B12 deficiency anemia, unspecified: Secondary | ICD-10-CM

## 2016-03-29 MED ORDER — CYANOCOBALAMIN 1000 MCG/ML IJ SOLN
1000.0000 ug | Freq: Once | INTRAMUSCULAR | Status: AC
  Administered 2016-03-29: 1000 ug via INTRAMUSCULAR

## 2016-03-29 NOTE — Patient Instructions (Signed)

## 2016-04-05 ENCOUNTER — Other Ambulatory Visit (HOSPITAL_BASED_OUTPATIENT_CLINIC_OR_DEPARTMENT_OTHER): Payer: BLUE CROSS/BLUE SHIELD

## 2016-04-05 ENCOUNTER — Ambulatory Visit (HOSPITAL_BASED_OUTPATIENT_CLINIC_OR_DEPARTMENT_OTHER): Payer: BLUE CROSS/BLUE SHIELD

## 2016-04-05 ENCOUNTER — Other Ambulatory Visit: Payer: Self-pay | Admitting: Hematology

## 2016-04-05 VITALS — BP 102/75 | HR 73 | Temp 98.1°F | Resp 20

## 2016-04-05 DIAGNOSIS — D519 Vitamin B12 deficiency anemia, unspecified: Secondary | ICD-10-CM

## 2016-04-05 DIAGNOSIS — E538 Deficiency of other specified B group vitamins: Secondary | ICD-10-CM

## 2016-04-05 LAB — CBC & DIFF AND RETIC
BASO%: 1.6 % (ref 0.0–2.0)
Basophils Absolute: 0.1 10*3/uL (ref 0.0–0.1)
EOS ABS: 0.1 10*3/uL (ref 0.0–0.5)
EOS%: 2.9 % (ref 0.0–7.0)
HCT: 37 % — ABNORMAL LOW (ref 38.4–49.9)
HGB: 12 g/dL — ABNORMAL LOW (ref 13.0–17.1)
Immature Retic Fract: 3.9 % (ref 3.00–10.60)
LYMPH%: 24.8 % (ref 14.0–49.0)
MCH: 31.8 pg (ref 27.2–33.4)
MCHC: 32.4 g/dL (ref 32.0–36.0)
MCV: 98.1 fL — AB (ref 79.3–98.0)
MONO#: 0.6 10*3/uL (ref 0.1–0.9)
MONO%: 12.3 % (ref 0.0–14.0)
NEUT%: 58.4 % (ref 39.0–75.0)
NEUTROS ABS: 2.6 10*3/uL (ref 1.5–6.5)
Platelets: 202 10*3/uL (ref 140–400)
RBC: 3.77 10*6/uL — AB (ref 4.20–5.82)
RDW: 15.3 % — AB (ref 11.0–14.6)
RETIC %: 1.08 % (ref 0.80–1.80)
Retic Ct Abs: 40.72 10*3/uL (ref 34.80–93.90)
WBC: 4.5 10*3/uL (ref 4.0–10.3)
lymph#: 1.1 10*3/uL (ref 0.9–3.3)

## 2016-04-05 MED ORDER — PANTOPRAZOLE SODIUM 40 MG PO TBEC: 40.0000 mg | DELAYED_RELEASE_TABLET | Freq: Two times a day (BID) | ORAL | 1 refills | Status: DC

## 2016-04-05 MED ORDER — CYANOCOBALAMIN 1000 MCG/ML IJ SOLN
1000.0000 ug | Freq: Once | INTRAMUSCULAR | Status: AC
  Administered 2016-04-05: 1000 ug via INTRAMUSCULAR

## 2016-04-05 MED ORDER — CYANOCOBALAMIN 1000 MCG/ML IJ SOLN
INTRAMUSCULAR | Status: AC
  Filled 2016-04-05: qty 1

## 2016-04-05 NOTE — Patient Instructions (Signed)

## 2016-04-12 ENCOUNTER — Ambulatory Visit: Payer: BLUE CROSS/BLUE SHIELD

## 2016-05-04 ENCOUNTER — Other Ambulatory Visit: Payer: BLUE CROSS/BLUE SHIELD

## 2016-05-04 ENCOUNTER — Encounter: Payer: Self-pay | Admitting: Hematology

## 2016-05-04 ENCOUNTER — Telehealth: Payer: Self-pay | Admitting: Hematology

## 2016-05-04 ENCOUNTER — Ambulatory Visit (HOSPITAL_BASED_OUTPATIENT_CLINIC_OR_DEPARTMENT_OTHER): Payer: BLUE CROSS/BLUE SHIELD | Admitting: Hematology

## 2016-05-04 ENCOUNTER — Ambulatory Visit (HOSPITAL_BASED_OUTPATIENT_CLINIC_OR_DEPARTMENT_OTHER): Payer: BLUE CROSS/BLUE SHIELD

## 2016-05-04 VITALS — BP 120/74 | HR 67 | Temp 98.4°F | Resp 17 | Ht 62.0 in | Wt 145.7 lb

## 2016-05-04 DIAGNOSIS — K219 Gastro-esophageal reflux disease without esophagitis: Secondary | ICD-10-CM

## 2016-05-04 DIAGNOSIS — D519 Vitamin B12 deficiency anemia, unspecified: Secondary | ICD-10-CM

## 2016-05-04 DIAGNOSIS — D61818 Other pancytopenia: Secondary | ICD-10-CM | POA: Diagnosis not present

## 2016-05-04 DIAGNOSIS — E538 Deficiency of other specified B group vitamins: Secondary | ICD-10-CM | POA: Diagnosis not present

## 2016-05-04 LAB — CBC & DIFF AND RETIC
BASO%: 1.1 % (ref 0.0–2.0)
Basophils Absolute: 0.1 10*3/uL (ref 0.0–0.1)
EOS ABS: 0.2 10*3/uL (ref 0.0–0.5)
EOS%: 3.5 % (ref 0.0–7.0)
HCT: 37.9 % — ABNORMAL LOW (ref 38.4–49.9)
HEMOGLOBIN: 12.7 g/dL — AB (ref 13.0–17.1)
Immature Retic Fract: 5.7 % (ref 3.00–10.60)
LYMPH%: 25.1 % (ref 14.0–49.0)
MCH: 31.4 pg (ref 27.2–33.4)
MCHC: 33.5 g/dL (ref 32.0–36.0)
MCV: 93.6 fL (ref 79.3–98.0)
MONO#: 0.6 10*3/uL (ref 0.1–0.9)
MONO%: 13.2 % (ref 0.0–14.0)
NEUT%: 57.1 % (ref 39.0–75.0)
NEUTROS ABS: 2.6 10*3/uL (ref 1.5–6.5)
Platelets: 229 10*3/uL (ref 140–400)
RBC: 4.05 10*6/uL — ABNORMAL LOW (ref 4.20–5.82)
RDW: 14.1 % (ref 11.0–14.6)
RETIC %: 1.55 % (ref 0.80–1.80)
Retic Ct Abs: 62.78 10*3/uL (ref 34.80–93.90)
WBC: 4.6 10*3/uL (ref 4.0–10.3)
lymph#: 1.2 10*3/uL (ref 0.9–3.3)

## 2016-05-04 MED ORDER — CYANOCOBALAMIN 1000 MCG/ML IJ SOLN
1000.0000 ug | Freq: Once | INTRAMUSCULAR | Status: AC
  Administered 2016-05-04: 1000 ug via INTRAMUSCULAR

## 2016-05-04 NOTE — Patient Instructions (Signed)

## 2016-05-04 NOTE — Telephone Encounter (Signed)
Gave patient avs report and appointments for October thru December  °

## 2016-05-04 NOTE — Progress Notes (Signed)
[place]  Telephone:(336) [phone] Fax:(336) [phone]  Clinic Follow up Note   Patient Care Team: No Pcp Per Patient as PCP - General (General Practice) 05/04/2016   CHIEF COMPLAIN:  Follow up B-12 deficient anemia.  History of present illness (03/01/2016): Jose Compton is a 60 year old male, originally from [place], was admitted to [facility] yesterday for severe anemia. I was called to see the patient for pancytopenia.  He has had GERD for a few years, and takes over-the-counter medications such as Tums, no other significant past medical history. He does not see doctor regularly. He was last here 2 year ago for chest pain, and a CBC was normal, except elevated MCV at 108.1. He has been feeling fatigue, dizzy, for the past week, and came to the emergency room the night before yesterday after work, for worsening symptoms and the pre-syncope. CBC in the ED showed WBC 3.2, hemoglobin 4.4, platelet 85,000, MCV 114. Differential showed increased bands (>20%). CMP was unremarkable except a mild hypokalemia, and total bilirubin 1.5.  He has received 3u RBC, feels much better. He denies any chest pain, peripheral neuropathy or other neurological symptoms. He has mild intermittent headache in the past few days, and palpitation. He has lost about 40lbs in the past 6 week, for which he contributes to the acid reflux and eating less. His bowel movement is normal or slightly constipated. He denies melena, only had 1 episode of hematochezia 6 months ago. No other signs of bleeding.  He is widowed, has been the [place] for 18 years, he has 2 children in [place]. He works for a Program[employer], systems, his job is physically demanding. He denies any history of liver disease, prior transfusion, he drinks alcohol occasionally. He is a never smoker. No family history of malignancy or blood disorders.  CURRENT THERAPY: B12 1045mg IM weekly started on 03/01/2016, changed to monthly on 04/05/16  INTERVAL  HISTORY: Jose MCuevasreturns for follow up. He is doing well overall. [place] His energy has much improved, back to his normal level now. He still has mild intermittent epigastric discomfort, possibly related to some food or larger meal. No other complaints. He is tolerating B12 injection very well.  REVIEW OF SYSTEMS:   Constitutional: Denies fevers, chills or abnormal weight loss Eyes: Denies blurriness of vision Ears, nose, mouth, throat, and face: Denies mucositis or sore throat Respiratory: Denies cough, dyspnea or wheezes Cardiovascular: Denies palpitation, chest discomfort or lower extremity swelling Gastrointestinal:  Denies nausea, heartburn or change in bowel habits Skin: Denies abnormal skin rashes Lymphatics: Denies new lymphadenopathy or easy bruising Neurological:Denies numbness, tingling or new weaknesses Behavioral/Psych: Mood is stable, no new changes  All other systems were reviewed with the patient and are negative.  MEDICAL HISTORY:  Past Medical History:  Diagnosis Date  . GERD (gastroesophageal reflux disease)     SURGICAL HISTORY: History reviewed. No pertinent surgical history.  I have reviewed the social history and family history with the patient and they are unchanged from previous note.  ALLERGIES:  has No Known Allergies.  MEDICATIONS:  Current Outpatient Prescriptions  Medication Sig Dispense Refill  . acetaminophen (TYLENOL) 500 MG tablet Take 1 tablet (500 mg total) by mouth every 6 (six) hours as needed for headache. 30 tablet 0  . cyanocobalamin (,VITAMIN B-12,) 1000 MCG/ML injection Inject 1 mL (1,000 mcg total) into the muscle every 7 (seven) days. 4 mL 0  . pantoprazole (PROTONIX) 40 MG  tablet Take 1 tablet (40 mg total) by mouth 2 (two) times daily. 60 tablet 1  . sucralfate (CARAFATE) 1 g tablet Take 1 tablet (1 g total) by mouth 4 (four) times daily -  with meals and at bedtime. 90 tablet 1   No current  facility-administered medications for this visit.     PHYSICAL EXAMINATION: ECOG PERFORMANCE STATUS: 1 - Symptomatic but completely ambulatory  Vitals:   05/04/16 0836  BP: 120/74  Pulse: 67  Resp: 17  Temp: 98.4 F (36.9 C)   Filed Weights   05/04/16 0836  Weight: 145 lb 11.2 oz (66.1 kg)    GENERAL:alert, no distress and comfortable SKIN: skin color, texture, turgor are normal, no rashes or significant lesions EYES: normal, Conjunctiva are pink and non-injected, sclera clear OROPHARYNX:no exudate, no erythema and lips, buccal mucosa, and tongue normal  NECK: supple, thyroid normal size, non-tender, without nodularity LYMPH:  no palpable lymphadenopathy in the cervical, axillary or inguinal LUNGS: clear to auscultation and percussion with normal breathing effort HEART: regular rate & rhythm and no murmurs and no lower extremity edema ABDOMEN:abdomen soft, non-tender and normal bowel sounds Musculoskeletal:no cyanosis of digits and no clubbing  NEURO: alert & oriented x 3 with fluent speech, no focal motor/sensory deficits  LABORATORY DATA:  I have reviewed the data as listed CBC Latest Ref Rng & Units 05/04/2016 04/05/2016 03/22/2016  WBC 4.0 - 10.3 10e3/uL 4.6 4.5 6.4  Hemoglobin 13.0 - 17.1 g/dL 12.7(L) 12.0(L) 10.8(L)  Hematocrit 38.4 - 49.9 % 37.9(L) 37.0(L) 32.8(L)  Platelets 140 - 400 10e3/uL 229 202 394     CMP Latest Ref Rng & Units 03/08/2016 03/03/2016 03/01/2016  Glucose 70 - 140 mg/dl 112 97 97  BUN 7.0 - 26.0 mg/dL 16.'9 12 10  '$ Creatinine 0.7 - 1.3 mg/dL 0.8 0.69 0.83  Sodium 136 - 145 mEq/L 138 137 140  Potassium 3.5 - 5.1 mEq/L 3.9 3.7 3.8  Chloride 101 - 111 mmol/L - 109 115(H)  CO2 22 - 29 mEq/L 25 24 20(L)  Calcium 8.4 - 10.4 mg/dL 9.3 8.0(L) 8.0(L)  Total Protein 6.4 - 8.3 g/dL 7.5 - -  Total Bilirubin 0.20 - 1.20 mg/dL 0.88 - -  Alkaline Phos 40 - 150 U/L 91 - -  AST 5 - 34 U/L 32 - -  ALT 0 - 55 U/L 30 - -      RADIOGRAPHIC STUDIES: I have  personally reviewed the radiological images as listed and agreed with the findings in the report. No results found.   ASSESSMENT & PLAN: 60 year old male, with PMH of GERD, otherwise healthy and fit, origin from [place], presented with symptomatic anemia.  1.B12 deficient anemia -His anemia has near resolved, hemoglobin 12.7 today. He responds to very well to B12 injection -His leukopenia and thrombocytopenia has resolved also, indicating good response to B12 injection. -I do not think he needs a bone marrow biopsy, since the cytopenias responded well -Continue B12 injection monthly -His intrinsic factor antibody was negative, will consider a course of oral B12 supplement, to see if he can maintain his B12 level is 3-6 months -Continue B12 injection monthly, I'll see him back in 3 months   2. GERD -He'll continue Protonix. -I encouraged him to have a primary care physician  All questions were answered. The patient knows to call the clinic with any problems, questions or concerns. No barriers to learning was detected.  I spent 15 minutes counseling the patient face to face. The total time  spent in the appointment was 20 minutes and more than 50% was on counseling and review of test results     Truitt Merle, MD 05/04/16

## 2016-06-01 ENCOUNTER — Ambulatory Visit (HOSPITAL_BASED_OUTPATIENT_CLINIC_OR_DEPARTMENT_OTHER): Payer: BLUE CROSS/BLUE SHIELD

## 2016-06-01 VITALS — BP 119/68 | HR 70 | Temp 98.3°F | Resp 18

## 2016-06-01 DIAGNOSIS — E538 Deficiency of other specified B group vitamins: Secondary | ICD-10-CM | POA: Diagnosis not present

## 2016-06-01 DIAGNOSIS — D519 Vitamin B12 deficiency anemia, unspecified: Secondary | ICD-10-CM

## 2016-06-01 MED ORDER — CYANOCOBALAMIN 1000 MCG/ML IJ SOLN
1000.0000 ug | Freq: Once | INTRAMUSCULAR | Status: AC
  Administered 2016-06-01: 1000 ug via INTRAMUSCULAR

## 2016-06-01 NOTE — Patient Instructions (Signed)

## 2016-06-29 ENCOUNTER — Ambulatory Visit (HOSPITAL_BASED_OUTPATIENT_CLINIC_OR_DEPARTMENT_OTHER): Payer: BLUE CROSS/BLUE SHIELD

## 2016-06-29 VITALS — BP 122/71 | HR 71 | Temp 98.4°F | Resp 16

## 2016-06-29 DIAGNOSIS — E538 Deficiency of other specified B group vitamins: Secondary | ICD-10-CM | POA: Diagnosis not present

## 2016-06-29 DIAGNOSIS — D519 Vitamin B12 deficiency anemia, unspecified: Secondary | ICD-10-CM

## 2016-06-29 MED ORDER — CYANOCOBALAMIN 1000 MCG/ML IJ SOLN
1000.0000 ug | Freq: Once | INTRAMUSCULAR | Status: AC
  Administered 2016-06-29: 1000 ug via INTRAMUSCULAR

## 2016-06-29 NOTE — Patient Instructions (Signed)

## 2016-07-26 NOTE — Progress Notes (Signed)
[place]  Telephone:(336) [phone] Fax:(336) [phone]  Clinic Follow up Note   Patient Care Team: No Pcp Per Patient as PCP - General (General Practice) 07/27/2016   CHIEF COMPLAIN:  Follow up B-12 deficient anemia.  History of present illness (03/01/2016): [name] is a 60 year old male, originally from [place], was admitted to [facility] yesterday for severe anemia. I was called to see the patient for pancytopenia.  He has had GERD for a few years, and takes over-the-counter medications such as Tums, no other significant past medical history. He does not see doctor regularly. He was last here 2 year ago for chest pain, and a CBC was normal, except elevated MCV at 108.1. He has been feeling fatigue, dizzy, for the past week, and came to the emergency room the night before yesterday after work, for worsening symptoms and the pre-syncope. CBC in the ED showed WBC 3.2, hemoglobin 4.4, platelet 85,000, MCV 114. Differential showed increased bands (>20%). CMP was unremarkable except a mild hypokalemia, and total bilirubin 1.5.  He has received 3u RBC, feels much better. He denies any chest pain, peripheral neuropathy or other neurological symptoms. He has mild intermittent headache in the past few days, and palpitation. He has lost about 40lbs in the past 6 week, for which he contributes to the acid reflux and eating less. His bowel movement is normal or slightly constipated. He denies melena, only had 1 episode of hematochezia 6 months ago. No other signs of bleeding.  He is widowed, has been the [place] for 18 years, he has 2 children in [place]. He works for a [employer], systems, his job is physically demanding. He denies any history of liver disease, prior transfusion, he drinks alcohol occasionally. He is a never smoker. No family history of malignancy or blood disorders.  CURRENT THERAPY: B12 1068mg IM weekly started on 03/01/2016, changed to monthly on 04/05/16  INTERVAL  HISTORY: Mr MBlanchettereturns for follow up. He is feeling well overall. He is requesting a refill of Protonix. He takes Protonix twice daily. He still experiences acid reflux, resolved with Protonix. He last had an EGD in 2007. He has not received a flu shot this year.   REVIEW OF SYSTEMS:   Constitutional: Denies fevers, chills or abnormal weight loss Eyes: Denies blurriness of vision Ears, nose, mouth, throat, and face: Denies mucositis or sore throat Respiratory: Denies cough, dyspnea or wheezes Cardiovascular: Denies palpitation, chest discomfort or lower extremity swelling Gastrointestinal:  Denies nausea, heartburn or change in bowel habits (+) acid reflux managed with Protonix Skin: Denies abnormal skin rashes Lymphatics: Denies new lymphadenopathy or easy bruising Neurological:Denies numbness, tingling or new weaknesses Behavioral/Psych: Mood is stable, no new changes  All other systems were reviewed with the patient and are negative.   MEDICAL HISTORY:  Past Medical History:  Diagnosis Date  . GERD (gastroesophageal reflux disease)     SURGICAL HISTORY: History reviewed. No pertinent surgical history.  I have reviewed the social history and family history with the patient and they are unchanged from previous note.  ALLERGIES:  has No Known Allergies.  MEDICATIONS:  Current Outpatient Prescriptions  Medication Sig Dispense Refill  . acetaminophen (TYLENOL) 500 MG tablet Take 1 tablet (500 mg total) by mouth every 6 (six) hours as needed for headache. 30 tablet 0  . cyanocobalamin (,VITAMIN B-12,) 1000 MCG/ML injection Inject 1 mL (1,000 mcg total) into the muscle every 7 (seven) days. 4 mL 0  . pantoprazole (PROTONIX) 40 MG tablet  Take 1 tablet (40 mg total) by mouth 2 (two) times daily. 60 tablet 3   No current facility-administered medications for this visit.     PHYSICAL EXAMINATION: ECOG PERFORMANCE STATUS: 0  Vitals:   07/27/16 0822  BP: 111/70  Pulse: 67    Resp: 18  Temp: 98.2 F (36.8 C)   Filed Weights   07/27/16 5409  Weight: 153 lb 1.6 oz (69.4 kg)    GENERAL:alert, no distress and comfortable SKIN: skin color, texture, turgor are normal, no rashes or significant lesions EYES: normal, Conjunctiva are pink and non-injected, sclera clear OROPHARYNX:no exudate, no erythema and lips, buccal mucosa, and tongue normal  NECK: supple, thyroid normal size, non-tender, without nodularity LYMPH:  no palpable lymphadenopathy in the cervical, axillary or inguinal LUNGS: clear to auscultation and percussion with normal breathing effort HEART: regular rate & rhythm and no murmurs and no lower extremity edema ABDOMEN:abdomen soft, non-tender and normal bowel sounds Musculoskeletal:no cyanosis of digits and no clubbing  NEURO: alert & oriented x 3 with fluent speech, no focal motor/sensory deficits   LABORATORY DATA:  I have reviewed the data as listed CBC Latest Ref Rng & Units 07/27/2016 05/04/2016 04/05/2016  WBC 4.0 - 10.3 10e3/uL 5.4 4.6 4.5  Hemoglobin 13.0 - 17.1 g/dL 13.8 12.7(L) 12.0(L)  Hematocrit 38.4 - 49.9 % 40.9 37.9(L) 37.0(L)  Platelets 140 - 400 10e3/uL 216 229 202     CMP Latest Ref Rng & Units 03/08/2016 03/03/2016 03/01/2016  Glucose 70 - 140 mg/dl 112 97 97  BUN 7.0 - 26.0 mg/dL 16.'9 12 10  '$ Creatinine 0.7 - 1.3 mg/dL 0.8 0.69 0.83  Sodium 136 - 145 mEq/L 138 137 140  Potassium 3.5 - 5.1 mEq/L 3.9 3.7 3.8  Chloride 101 - 111 mmol/L - 109 115(H)  CO2 22 - 29 mEq/L 25 24 20(L)  Calcium 8.4 - 10.4 mg/dL 9.3 8.0(L) 8.0(L)  Total Protein 6.4 - 8.3 g/dL 7.5 - -  Total Bilirubin 0.20 - 1.20 mg/dL 0.88 - -  Alkaline Phos 40 - 150 U/L 91 - -  AST 5 - 34 U/L 32 - -  ALT 0 - 55 U/L 30 - -      RADIOGRAPHIC STUDIES: I have personally reviewed the radiological images as listed and agreed with the findings in the report. No results found.   ASSESSMENT & PLAN: 60 year old male, with PMH of GERD, otherwise healthy and fit, origin  from [place], presented with symptomatic anemia.  1.B12 deficient anemia -His anemia has near resolved, hemoglobin 13.8 today. He responds to very well to B12 injection -His leukopenia and thrombocytopenia has resolved also, indicating good response to B12 injection. -I do not think he needs a bone marrow biopsy, since the cytopenias responded well -His intrinsic factor antibody was negative, it's unlikely pernicious anemia  -We discussed the option of continued B-12 injection monthly versus trial of oral B-12, he opted B12 injection. -Continue B12 injection monthly  2. GERD -He'll continue Protonix. I refilled Protonix today -I encouraged him to have a primary care physician  -Last EGD 06/03/2006 with Dr. Ardis Hughs was unremarkable -I'll ask Dr. Ardis Hughs to see if he needs a repeated EGD  Plan: -Refilled Protonix today -Continue B12 injection monthly with labs every 2 months -He will receive a flu shot in our clinic today -I will speak with GI if he needs a repeated EGD -RTC in 6 months for follow up  All questions were answered. The patient knows to call the clinic with  any problems, questions or concerns. No barriers to learning was detected.  I spent 15 minutes counseling the patient face to face. The total time spent in the appointment was 20 minutes and more than 50% was on counseling and review of test results  This document serves as a record of services personally performed by Truitt Merle, MD. It was created on her behalf by Arlyce Harman, a trained medical scribe. The creation of this record is based on the scribe's personal observations and the provider's statements to them. This document has been checked and approved by the attending provider.    Truitt Merle, MD 07/27/16

## 2016-07-27 ENCOUNTER — Telehealth: Payer: Self-pay | Admitting: Hematology

## 2016-07-27 ENCOUNTER — Other Ambulatory Visit (HOSPITAL_BASED_OUTPATIENT_CLINIC_OR_DEPARTMENT_OTHER): Payer: BLUE CROSS/BLUE SHIELD

## 2016-07-27 ENCOUNTER — Ambulatory Visit (HOSPITAL_BASED_OUTPATIENT_CLINIC_OR_DEPARTMENT_OTHER): Payer: BLUE CROSS/BLUE SHIELD | Admitting: Hematology

## 2016-07-27 ENCOUNTER — Ambulatory Visit (HOSPITAL_BASED_OUTPATIENT_CLINIC_OR_DEPARTMENT_OTHER): Payer: BLUE CROSS/BLUE SHIELD

## 2016-07-27 ENCOUNTER — Encounter: Payer: Self-pay | Admitting: Hematology

## 2016-07-27 VITALS — BP 111/70 | HR 67 | Temp 98.2°F | Resp 18 | Ht 62.0 in | Wt 153.1 lb

## 2016-07-27 DIAGNOSIS — D649 Anemia, unspecified: Secondary | ICD-10-CM

## 2016-07-27 DIAGNOSIS — Z23 Encounter for immunization: Secondary | ICD-10-CM

## 2016-07-27 DIAGNOSIS — D61818 Other pancytopenia: Secondary | ICD-10-CM | POA: Diagnosis not present

## 2016-07-27 DIAGNOSIS — E538 Deficiency of other specified B group vitamins: Secondary | ICD-10-CM | POA: Diagnosis not present

## 2016-07-27 DIAGNOSIS — E876 Hypokalemia: Secondary | ICD-10-CM

## 2016-07-27 DIAGNOSIS — D519 Vitamin B12 deficiency anemia, unspecified: Secondary | ICD-10-CM

## 2016-07-27 LAB — CBC & DIFF AND RETIC
BASO%: 0.6 % (ref 0.0–2.0)
BASOS ABS: 0 10*3/uL (ref 0.0–0.1)
EOS ABS: 0.1 10*3/uL (ref 0.0–0.5)
EOS%: 2.6 % (ref 0.0–7.0)
HEMATOCRIT: 40.9 % (ref 38.4–49.9)
HEMOGLOBIN: 13.8 g/dL (ref 13.0–17.1)
Immature Retic Fract: 6.9 % (ref 3.00–10.60)
LYMPH%: 29.9 % (ref 14.0–49.0)
MCH: 29.7 pg (ref 27.2–33.4)
MCHC: 33.7 g/dL (ref 32.0–36.0)
MCV: 88 fL (ref 79.3–98.0)
MONO#: 0.7 10*3/uL (ref 0.1–0.9)
MONO%: 12.2 % (ref 0.0–14.0)
NEUT%: 54.7 % (ref 39.0–75.0)
NEUTROS ABS: 3 10*3/uL (ref 1.5–6.5)
Platelets: 216 10*3/uL (ref 140–400)
RBC: 4.65 10*6/uL (ref 4.20–5.82)
RDW: 14 % (ref 11.0–14.6)
Retic %: 1.38 % (ref 0.80–1.80)
Retic Ct Abs: 64.17 10*3/uL (ref 34.80–93.90)
WBC: 5.4 10*3/uL (ref 4.0–10.3)
lymph#: 1.6 10*3/uL (ref 0.9–3.3)

## 2016-07-27 MED ORDER — CYANOCOBALAMIN 1000 MCG/ML IJ SOLN
1000.0000 ug | Freq: Once | INTRAMUSCULAR | Status: AC
  Administered 2016-07-27: 1000 ug via INTRAMUSCULAR

## 2016-07-27 MED ORDER — INFLUENZA VAC SPLIT QUAD 0.5 ML IM SUSY
0.5000 mL | PREFILLED_SYRINGE | Freq: Once | INTRAMUSCULAR | Status: AC
  Administered 2016-07-27: 0.5 mL via INTRAMUSCULAR
  Filled 2016-07-27: qty 0.5

## 2016-07-27 MED ORDER — PANTOPRAZOLE SODIUM 40 MG PO TBEC: 40.0000 mg | DELAYED_RELEASE_TABLET | Freq: Two times a day (BID) | ORAL | 3 refills | Status: DC

## 2016-07-27 NOTE — Telephone Encounter (Signed)
Appointments scheduled per 07/27/16 los. A copy of the AVS report and appointment schedule was given to the patient, per 07/27/16 los. °

## 2016-07-27 NOTE — Patient Instructions (Signed)
Influenza Virus Vaccine (Flucelvax) What is this medicine? INFLUENZA VIRUS VACCINE (in floo EN zuh VAHY ruhs vak SEEN) helps to reduce the risk of getting influenza also known as the flu. The vaccine only helps protect you against some strains of the flu. COMMON BRAND NAME(S): FLUCELVAX What should I tell my health care provider before I take this medicine? They need to know if you have any of these conditions: -bleeding disorder like hemophilia -fever or infection -Guillain-Barre syndrome or other neurological problems -immune system problems -infection with the human immunodeficiency virus (HIV) or AIDS -low blood platelet counts -multiple sclerosis -an unusual or allergic reaction to influenza virus vaccine, other medicines, foods, dyes or preservatives -pregnant or trying to get pregnant -breast-feeding How should I use this medicine? This vaccine is for injection into a muscle. It is given by a health care professional. A copy of Vaccine Information Statements will be given before each vaccination. Read this sheet carefully each time. The sheet may change frequently. Talk to your pediatrician regarding the use of this medicine in children. Special care may be needed. Overdosage: If you think you've taken too much of this medicine contact a poison control center or emergency room at once. What if I miss a dose? This does not apply. What may interact with this medicine? -chemotherapy or radiation therapy -medicines that lower your immune system like etanercept, anakinra, infliximab, and adalimumab -medicines that treat or prevent blood clots like warfarin -phenytoin -steroid medicines like prednisone or cortisone -theophylline -vaccines What should I watch for while using this medicine? Report any side effects that do not go away within 3 days to your doctor or health care professional. Call your health care provider if any unusual symptoms occur within 6 weeks of receiving this  vaccine. You may still catch the flu, but the illness is not usually as bad. You cannot get the flu from the vaccine. The vaccine will not protect against colds or other illnesses that may cause fever. The vaccine is needed every year. What side effects may I notice from receiving this medicine? Side effects that you should report to your doctor or health care professional as soon as possible: -allergic reactions like skin rash, itching or hives, swelling of the face, lips, or tongue Side effects that usually do not require medical attention (Report these to your doctor or health care professional if they continue or are bothersome.): -fever -headache -muscle aches and pains -pain, tenderness, redness, or swelling at the injection site -tiredness Where should I keep my medicine? The vaccine will be given by a health care professional in a clinic, pharmacy, doctor's office, or other health care setting. You will not be given vaccine doses to store at home.  2017 [employer] (2011-07-25 14:06:47) Cyanocobalamin, Vitamin B12 injection What is this medicine? CYANOCOBALAMIN (sye an oh koe BAL a min) is a man made form of vitamin B12. Vitamin B12 is used in the growth of healthy blood cells, nerve cells, and proteins in the body. It also helps with the metabolism of fats and carbohydrates. This medicine is used to treat people who can not absorb vitamin B12. This medicine may be used for other purposes; ask your health care provider or pharmacist if you have questions. What should I tell my health care provider before I take this medicine? They need to know if you have any of these conditions: -kidney disease -Leber's disease -megaloblastic anemia -an unusual or allergic reaction to cyanocobalamin, cobalt, other medicines, foods, dyes, or preservatives -  pregnant or trying to get pregnant -breast-feeding How should I use this medicine? This medicine is injected into a muscle or deeply  under the skin. It is usually given by a health care professional in a clinic or doctor's office. However, your doctor may teach you how to inject yourself. Follow all instructions. Talk to your pediatrician regarding the use of this medicine in children. Special care may be needed. Overdosage: If you think you have taken too much of this medicine contact a poison control center or emergency room at once. NOTE: This medicine is only for you. Do not share this medicine with others. What if I miss a dose? If you are given your dose at a clinic or doctor's office, call to reschedule your appointment. If you give your own injections and you miss a dose, take it as soon as you can. If it is almost time for your next dose, take only that dose. Do not take double or extra doses. What may interact with this medicine? -colchicine -heavy alcohol intake This list may not describe all possible interactions. Give your health care provider a list of all the medicines, herbs, non-prescription drugs, or dietary supplements you use. Also tell them if you smoke, drink alcohol, or use illegal drugs. Some items may interact with your medicine. What should I watch for while using this medicine? Visit your doctor or health care professional regularly. You may need blood work done while you are taking this medicine. You may need to follow a special diet. Talk to your doctor. Limit your alcohol intake and avoid smoking to get the best benefit. What side effects may I notice from receiving this medicine? Side effects that you should report to your doctor or health care professional as soon as possible: -allergic reactions like skin rash, itching or hives, swelling of the face, lips, or tongue -blue tint to skin -chest tightness, pain -difficulty breathing, wheezing -dizziness -red, swollen painful area on the leg Side effects that usually do not require medical attention (report to your doctor or health care  professional if they continue or are bothersome): -diarrhea -headache This list may not describe all possible side effects. Call your doctor for medical advice about side effects. You may report side effects to FDA at 1-800-FDA-1088. Where should I keep my medicine? Keep out of the reach of children. Store at room temperature between 15 and 30 degrees C (59 and 85 degrees F). Protect from light. Throw away any unused medicine after the expiration date. NOTE: This sheet is a summary. It may not cover all possible information. If you have questions about this medicine, talk to your doctor, pharmacist, or health care provider.    2016, [employer]. (2007-11-24 22:10:20)

## 2016-08-24 ENCOUNTER — Ambulatory Visit (HOSPITAL_BASED_OUTPATIENT_CLINIC_OR_DEPARTMENT_OTHER): Payer: BLUE CROSS/BLUE SHIELD

## 2016-08-24 VITALS — BP 114/76 | HR 75 | Temp 98.2°F | Resp 18

## 2016-08-24 DIAGNOSIS — E538 Deficiency of other specified B group vitamins: Secondary | ICD-10-CM

## 2016-08-24 DIAGNOSIS — D519 Vitamin B12 deficiency anemia, unspecified: Secondary | ICD-10-CM

## 2016-08-24 MED ORDER — CYANOCOBALAMIN 1000 MCG/ML IJ SOLN
1000.0000 ug | Freq: Once | INTRAMUSCULAR | Status: AC
  Administered 2016-08-24: 1000 ug via INTRAMUSCULAR

## 2016-08-24 NOTE — Patient Instructions (Signed)

## 2016-09-21 ENCOUNTER — Ambulatory Visit (HOSPITAL_BASED_OUTPATIENT_CLINIC_OR_DEPARTMENT_OTHER): Payer: BLUE CROSS/BLUE SHIELD

## 2016-09-21 ENCOUNTER — Other Ambulatory Visit (HOSPITAL_BASED_OUTPATIENT_CLINIC_OR_DEPARTMENT_OTHER): Payer: BLUE CROSS/BLUE SHIELD

## 2016-09-21 VITALS — BP 136/72 | HR 75 | Temp 97.9°F | Resp 18

## 2016-09-21 DIAGNOSIS — E538 Deficiency of other specified B group vitamins: Secondary | ICD-10-CM

## 2016-09-21 DIAGNOSIS — D519 Vitamin B12 deficiency anemia, unspecified: Secondary | ICD-10-CM

## 2016-09-21 LAB — CBC & DIFF AND RETIC
BASO%: 0.8 % (ref 0.0–2.0)
Basophils Absolute: 0.1 10*3/uL (ref 0.0–0.1)
EOS ABS: 0.1 10*3/uL (ref 0.0–0.5)
EOS%: 2 % (ref 0.0–7.0)
HCT: 43.7 % (ref 38.4–49.9)
HEMOGLOBIN: 14.5 g/dL (ref 13.0–17.1)
IMMATURE RETIC FRACT: 11.3 % — AB (ref 3.00–10.60)
LYMPH%: 28.2 % (ref 14.0–49.0)
MCH: 30.2 pg (ref 27.2–33.4)
MCHC: 33.2 g/dL (ref 32.0–36.0)
MCV: 91 fL (ref 79.3–98.0)
MONO#: 0.6 10*3/uL (ref 0.1–0.9)
MONO%: 8.7 % (ref 0.0–14.0)
NEUT%: 60.3 % (ref 39.0–75.0)
NEUTROS ABS: 4 10*3/uL (ref 1.5–6.5)
Platelets: 225 10*3/uL (ref 140–400)
RBC: 4.8 10*6/uL (ref 4.20–5.82)
RDW: 13.5 % (ref 11.0–14.6)
RETIC %: 1.54 % (ref 0.80–1.80)
Retic Ct Abs: 73.92 10*3/uL (ref 34.80–93.90)
WBC: 6.6 10*3/uL (ref 4.0–10.3)
lymph#: 1.9 10*3/uL (ref 0.9–3.3)

## 2016-09-21 MED ORDER — CYANOCOBALAMIN 1000 MCG/ML IJ SOLN
1000.0000 ug | Freq: Once | INTRAMUSCULAR | Status: AC
  Administered 2016-09-21: 1000 ug via INTRAMUSCULAR

## 2016-09-21 NOTE — Patient Instructions (Signed)
Cyanocobalamin, Vitamin B12 injection What is this medicine? CYANOCOBALAMIN (sye an oh koe BAL a min) is a man made form of vitamin B12. Vitamin B12 is used in the growth of healthy blood cells, nerve cells, and proteins in the body. It also helps with the metabolism of fats and carbohydrates. This medicine is used to treat people who can not absorb vitamin B12. This medicine may be used for other purposes; ask your health care provider or pharmacist if you have questions. COMMON BRAND NAME(S): B-12 Compliance Kit, B-12 Injection Kit, Cyomin, LA-12, Nutri-Twelve, Physicians EZ Use B-12, Primabalt What should I tell my health care provider before I take this medicine? They need to know if you have any of these conditions: -kidney disease -Leber's disease -megaloblastic anemia -an unusual or allergic reaction to cyanocobalamin, cobalt, other medicines, foods, dyes, or preservatives -pregnant or trying to get pregnant -breast-feeding How should I use this medicine? This medicine is injected into a muscle or deeply under the skin. It is usually given by a health care professional in a clinic or doctor's office. However, your doctor may teach you how to inject yourself. Follow all instructions. Talk to your pediatrician regarding the use of this medicine in children. Special care may be needed. Overdosage: If you think you have taken too much of this medicine contact a poison control center or emergency room at once. NOTE: This medicine is only for you. Do not share this medicine with others. What if I miss a dose? If you are given your dose at a clinic or doctor's office, call to reschedule your appointment. If you give your own injections and you miss a dose, take it as soon as you can. If it is almost time for your next dose, take only that dose. Do not take double or extra doses. What may interact with this medicine? -colchicine -heavy alcohol intake This list may not describe all possible  interactions. Give your health care provider a list of all the medicines, herbs, non-prescription drugs, or dietary supplements you use. Also tell them if you smoke, drink alcohol, or use illegal drugs. Some items may interact with your medicine. What should I watch for while using this medicine? Visit your doctor or health care professional regularly. You may need blood work done while you are taking this medicine. You may need to follow a special diet. Talk to your doctor. Limit your alcohol intake and avoid smoking to get the best benefit. What side effects may I notice from receiving this medicine? Side effects that you should report to your doctor or health care professional as soon as possible: -allergic reactions like skin rash, itching or hives, swelling of the face, lips, or tongue -blue tint to skin -chest tightness, pain -difficulty breathing, wheezing -dizziness -red, swollen painful area on the leg Side effects that usually do not require medical attention (report to your doctor or health care professional if they continue or are bothersome): -diarrhea -headache This list may not describe all possible side effects. Call your doctor for medical advice about side effects. You may report side effects to FDA at 1-800-FDA-1088. Where should I keep my medicine? Keep out of the reach of children. Store at room temperature between 15 and 30 degrees C (59 and 85 degrees F). Protect from light. Throw away any unused medicine after the expiration date. NOTE: This sheet is a summary. It may not cover all possible information. If you have questions about this medicine, talk to your doctor, pharmacist, or   health care provider.  2017 [employer] (2007-11-24 22:10:20)  

## 2016-10-19 ENCOUNTER — Ambulatory Visit (HOSPITAL_BASED_OUTPATIENT_CLINIC_OR_DEPARTMENT_OTHER): Payer: BLUE CROSS/BLUE SHIELD

## 2016-10-19 VITALS — BP 119/66 | HR 71 | Temp 98.3°F | Resp 18

## 2016-10-19 DIAGNOSIS — E538 Deficiency of other specified B group vitamins: Secondary | ICD-10-CM | POA: Diagnosis not present

## 2016-10-19 DIAGNOSIS — D519 Vitamin B12 deficiency anemia, unspecified: Secondary | ICD-10-CM

## 2016-10-19 MED ORDER — CYANOCOBALAMIN 1000 MCG/ML IJ SOLN
1000.0000 ug | Freq: Once | INTRAMUSCULAR | Status: AC
  Administered 2016-10-19: 1000 ug via INTRAMUSCULAR

## 2016-10-19 NOTE — Patient Instructions (Signed)
Cyanocobalamin, Vitamin B12 injection What is this medicine? CYANOCOBALAMIN (sye an oh koe BAL a min) is a man made form of vitamin B12. Vitamin B12 is used in the growth of healthy blood cells, nerve cells, and proteins in the body. It also helps with the metabolism of fats and carbohydrates. This medicine is used to treat people who can not absorb vitamin B12. This medicine may be used for other purposes; ask your health care provider or pharmacist if you have questions. COMMON BRAND NAME(S): B-12 Compliance Kit, B-12 Injection Kit, Cyomin, LA-12, Nutri-Twelve, Physicians EZ Use B-12, Primabalt What should I tell my health care provider before I take this medicine? They need to know if you have any of these conditions: -kidney disease -Leber's disease -megaloblastic anemia -an unusual or allergic reaction to cyanocobalamin, cobalt, other medicines, foods, dyes, or preservatives -pregnant or trying to get pregnant -breast-feeding How should I use this medicine? This medicine is injected into a muscle or deeply under the skin. It is usually given by a health care professional in a clinic or doctor's office. However, your doctor may teach you how to inject yourself. Follow all instructions. Talk to your pediatrician regarding the use of this medicine in children. Special care may be needed. Overdosage: If you think you have taken too much of this medicine contact a poison control center or emergency room at once. NOTE: This medicine is only for you. Do not share this medicine with others. What if I miss a dose? If you are given your dose at a clinic or doctor's office, call to reschedule your appointment. If you give your own injections and you miss a dose, take it as soon as you can. If it is almost time for your next dose, take only that dose. Do not take double or extra doses. What may interact with this medicine? -colchicine -heavy alcohol intake This list may not describe all possible  interactions. Give your health care provider a list of all the medicines, herbs, non-prescription drugs, or dietary supplements you use. Also tell them if you smoke, drink alcohol, or use illegal drugs. Some items may interact with your medicine. What should I watch for while using this medicine? Visit your doctor or health care professional regularly. You may need blood work done while you are taking this medicine. You may need to follow a special diet. Talk to your doctor. Limit your alcohol intake and avoid smoking to get the best benefit. What side effects may I notice from receiving this medicine? Side effects that you should report to your doctor or health care professional as soon as possible: -allergic reactions like skin rash, itching or hives, swelling of the face, lips, or tongue -blue tint to skin -chest tightness, pain -difficulty breathing, wheezing -dizziness -red, swollen painful area on the leg Side effects that usually do not require medical attention (report to your doctor or health care professional if they continue or are bothersome): -diarrhea -headache This list may not describe all possible side effects. Call your doctor for medical advice about side effects. You may report side effects to FDA at 1-800-FDA-1088. Where should I keep my medicine? Keep out of the reach of children. Store at room temperature between 15 and 30 degrees C (59 and 85 degrees F). Protect from light. Throw away any unused medicine after the expiration date. NOTE: This sheet is a summary. It may not cover all possible information. If you have questions about this medicine, talk to your doctor, pharmacist, or   health care provider.  2017 [employer] (2007-11-24 22:10:20)  

## 2016-11-15 ENCOUNTER — Other Ambulatory Visit: Payer: Self-pay | Admitting: Hematology

## 2016-11-16 ENCOUNTER — Ambulatory Visit (HOSPITAL_BASED_OUTPATIENT_CLINIC_OR_DEPARTMENT_OTHER): Payer: BLUE CROSS/BLUE SHIELD

## 2016-11-16 ENCOUNTER — Other Ambulatory Visit: Payer: Self-pay | Admitting: Hematology

## 2016-11-16 ENCOUNTER — Other Ambulatory Visit (HOSPITAL_BASED_OUTPATIENT_CLINIC_OR_DEPARTMENT_OTHER): Payer: BLUE CROSS/BLUE SHIELD

## 2016-11-16 DIAGNOSIS — E538 Deficiency of other specified B group vitamins: Secondary | ICD-10-CM | POA: Diagnosis not present

## 2016-11-16 DIAGNOSIS — D519 Vitamin B12 deficiency anemia, unspecified: Secondary | ICD-10-CM

## 2016-11-16 LAB — CBC & DIFF AND RETIC
BASO%: 0.6 % (ref 0.0–2.0)
Basophils Absolute: 0 10*3/uL (ref 0.0–0.1)
EOS%: 2 % (ref 0.0–7.0)
Eosinophils Absolute: 0.1 10*3/uL (ref 0.0–0.5)
HEMATOCRIT: 43.6 % (ref 38.4–49.9)
HGB: 14.8 g/dL (ref 13.0–17.1)
Immature Retic Fract: 8.1 % (ref 3.00–10.60)
LYMPH%: 28.2 % (ref 14.0–49.0)
MCH: 30.6 pg (ref 27.2–33.4)
MCHC: 33.9 g/dL (ref 32.0–36.0)
MCV: 90.1 fL (ref 79.3–98.0)
MONO#: 0.7 10*3/uL (ref 0.1–0.9)
MONO%: 9.8 % (ref 0.0–14.0)
NEUT#: 4.1 10*3/uL (ref 1.5–6.5)
NEUT%: 59.4 % (ref 39.0–75.0)
PLATELETS: 228 10*3/uL (ref 140–400)
RBC: 4.84 10*6/uL (ref 4.20–5.82)
RDW: 13.2 % (ref 11.0–14.6)
Retic %: 1.43 % (ref 0.80–1.80)
Retic Ct Abs: 69.21 10*3/uL (ref 34.80–93.90)
WBC: 6.9 10*3/uL (ref 4.0–10.3)
lymph#: 2 10*3/uL (ref 0.9–3.3)
nRBC: 0 % (ref 0–0)

## 2016-11-16 MED ORDER — PANTOPRAZOLE SODIUM 40 MG PO TBEC: 40.0000 mg | DELAYED_RELEASE_TABLET | Freq: Two times a day (BID) | ORAL | 3 refills | Status: AC

## 2016-11-16 MED ORDER — CYANOCOBALAMIN 1000 MCG/ML IJ SOLN
1000.0000 ug | Freq: Once | INTRAMUSCULAR | Status: AC
  Administered 2016-11-16: 1000 ug via INTRAMUSCULAR

## 2016-11-16 NOTE — Patient Instructions (Signed)
Cyanocobalamin, Vitamin B12 injection What is this medicine? CYANOCOBALAMIN (sye an oh koe BAL a min) is a man made form of vitamin B12. Vitamin B12 is used in the growth of healthy blood cells, nerve cells, and proteins in the body. It also helps with the metabolism of fats and carbohydrates. This medicine is used to treat people who can not absorb vitamin B12. This medicine may be used for other purposes; ask your health care provider or pharmacist if you have questions. COMMON BRAND NAME(S): B-12 Compliance Kit, B-12 Injection Kit, Cyomin, LA-12, Nutri-Twelve, Physicians EZ Use B-12, Primabalt What should I tell my health care provider before I take this medicine? They need to know if you have any of these conditions: -kidney disease -Leber's disease -megaloblastic anemia -an unusual or allergic reaction to cyanocobalamin, cobalt, other medicines, foods, dyes, or preservatives -pregnant or trying to get pregnant -breast-feeding How should I use this medicine? This medicine is injected into a muscle or deeply under the skin. It is usually given by a health care professional in a clinic or doctor's office. However, your doctor may teach you how to inject yourself. Follow all instructions. Talk to your pediatrician regarding the use of this medicine in children. Special care may be needed. Overdosage: If you think you have taken too much of this medicine contact a poison control center or emergency room at once. NOTE: This medicine is only for you. Do not share this medicine with others. What if I miss a dose? If you are given your dose at a clinic or doctor's office, call to reschedule your appointment. If you give your own injections and you miss a dose, take it as soon as you can. If it is almost time for your next dose, take only that dose. Do not take double or extra doses. What may interact with this medicine? -colchicine -heavy alcohol intake This list may not describe all possible  interactions. Give your health care provider a list of all the medicines, herbs, non-prescription drugs, or dietary supplements you use. Also tell them if you smoke, drink alcohol, or use illegal drugs. Some items may interact with your medicine. What should I watch for while using this medicine? Visit your doctor or health care professional regularly. You may need blood work done while you are taking this medicine. You may need to follow a special diet. Talk to your doctor. Limit your alcohol intake and avoid smoking to get the best benefit. What side effects may I notice from receiving this medicine? Side effects that you should report to your doctor or health care professional as soon as possible: -allergic reactions like skin rash, itching or hives, swelling of the face, lips, or tongue -blue tint to skin -chest tightness, pain -difficulty breathing, wheezing -dizziness -red, swollen painful area on the leg Side effects that usually do not require medical attention (report to your doctor or health care professional if they continue or are bothersome): -diarrhea -headache This list may not describe all possible side effects. Call your doctor for medical advice about side effects. You may report side effects to FDA at 1-800-FDA-1088. Where should I keep my medicine? Keep out of the reach of children. Store at room temperature between 15 and 30 degrees C (59 and 85 degrees F). Protect from light. Throw away any unused medicine after the expiration date. NOTE: This sheet is a summary. It may not cover all possible information. If you have questions about this medicine, talk to your doctor, pharmacist, or   health care provider.  2017 [employer] (2007-11-24 22:10:20)  

## 2016-12-14 ENCOUNTER — Ambulatory Visit (HOSPITAL_BASED_OUTPATIENT_CLINIC_OR_DEPARTMENT_OTHER): Payer: BLUE CROSS/BLUE SHIELD

## 2016-12-14 VITALS — BP 112/70 | HR 72 | Temp 98.1°F | Resp 18

## 2016-12-14 DIAGNOSIS — E538 Deficiency of other specified B group vitamins: Secondary | ICD-10-CM

## 2016-12-14 DIAGNOSIS — D519 Vitamin B12 deficiency anemia, unspecified: Secondary | ICD-10-CM

## 2016-12-14 MED ORDER — CYANOCOBALAMIN 1000 MCG/ML IJ SOLN
1000.0000 ug | Freq: Once | INTRAMUSCULAR | Status: AC
  Administered 2016-12-14: 1000 ug via INTRAMUSCULAR

## 2016-12-14 NOTE — Patient Instructions (Signed)
°Cyclophosphamide injection °What is this medicine? °CYCLOPHOSPHAMIDE (sye kloe FOSS fa mide) is a chemotherapy drug. It slows the growth of cancer cells. This medicine is used to treat many types of cancer like lymphoma, myeloma, leukemia, breast cancer, and ovarian cancer, to name a few. °This medicine may be used for other purposes; ask your health care provider or pharmacist if you have questions. °COMMON BRAND NAME(S): Cytoxan, Neosar °What should I tell my health care provider before I take this medicine? °They need to know if you have any of these conditions: °-blood disorders °-history of other chemotherapy °-infection °-kidney disease °-liver disease °-recent or ongoing radiation therapy °-tumors in the bone marrow °-an unusual or allergic reaction to cyclophosphamide, other chemotherapy, other medicines, foods, dyes, or preservatives °-pregnant or trying to get pregnant °-breast-feeding °How should I use this medicine? °This drug is usually given as an injection into a vein or muscle or by infusion into a vein. It is administered in a hospital or clinic by a specially trained health care professional. °Talk to your pediatrician regarding the use of this medicine in children. Special care may be needed. °Overdosage: If you think you have taken too much of this medicine contact a poison control center or emergency room at once. °NOTE: This medicine is only for you. Do not share this medicine with others. °What if I miss a dose? °It is important not to miss your dose. Call your doctor or health care professional if you are unable to keep an appointment. °What may interact with this medicine? °This medicine may interact with the following medications: °-amiodarone °-amphotericin B °-azathioprine °-certain antiviral medicines for HIV or AIDS such as protease inhibitors (e.g., indinavir, ritonavir) and zidovudine °-certain blood pressure medications such as benazepril, captopril, enalapril, fosinopril,  lisinopril, moexipril, monopril, perindopril, quinapril, ramipril, trandolapril °-certain cancer medications such as anthracyclines (e.g., daunorubicin, doxorubicin), busulfan, cytarabine, paclitaxel, pentostatin, tamoxifen, trastuzumab °-certain diuretics such as chlorothiazide, chlorthalidone, hydrochlorothiazide, indapamide, metolazone °-certain medicines that treat or prevent blood clots like warfarin °-certain muscle relaxants such as succinylcholine °-cyclosporine °-etanercept °-indomethacin °-medicines to increase blood counts like filgrastim, pegfilgrastim, sargramostim °-medicines used as general anesthesia °-metronidazole °-natalizumab °This list may not describe all possible interactions. Give your health care provider a list of all the medicines, herbs, non-prescription drugs, or dietary supplements you use. Also tell them if you smoke, drink alcohol, or use illegal drugs. Some items may interact with your medicine. °What should I watch for while using this medicine? °Visit your doctor for checks on your progress. This drug may make you feel generally unwell. This is not uncommon, as chemotherapy can affect healthy cells as well as cancer cells. Report any side effects. Continue your course of treatment even though you feel ill unless your doctor tells you to stop. °Drink water or other fluids as directed. Urinate often, even at night. °In some cases, you may be given additional medicines to help with side effects. Follow all directions for their use. °Call your doctor or health care professional for advice if you get a fever, chills or sore throat, or other symptoms of a cold or flu. Do not treat yourself. This drug decreases your body's ability to fight infections. Try to avoid being around people who are sick. °This medicine may increase your risk to bruise or bleed. Call your doctor or health care professional if you notice any unusual bleeding. °Be careful brushing and flossing your teeth or using a  toothpick because you may get an infection or bleed   more easily. If you have any dental work done, tell your dentist you are receiving this medicine. °You may get drowsy or dizzy. Do not drive, use machinery, or do anything that needs mental alertness until you know how this medicine affects you. °Do not become pregnant while taking this medicine or for 1 year after stopping it. Women should inform their doctor if they wish to become pregnant or think they might be pregnant. Men should not father a child while taking this medicine and for 4 months after stopping it. There is a potential for serious side effects to an unborn child. Talk to your health care professional or pharmacist for more information. Do not breast-feed an infant while taking this medicine. °This medicine may interfere with the ability to have a child. This medicine has caused ovarian failure in some women. This medicine has caused reduced sperm counts in some men. You should talk with your doctor or health care professional if you are concerned about your fertility. °If you are going to have surgery, tell your doctor or health care professional that you have taken this medicine. °What side effects may I notice from receiving this medicine? °Side effects that you should report to your doctor or health care professional as soon as possible: °-allergic reactions like skin rash, itching or hives, swelling of the face, lips, or tongue °-low blood counts - this medicine may decrease the number of white blood cells, red blood cells and platelets. You may be at increased risk for infections and bleeding. °-signs of infection - fever or chills, cough, sore throat, pain or difficulty passing urine °-signs of decreased platelets or bleeding - bruising, pinpoint red spots on the skin, black, tarry stools, blood in the urine °-signs of decreased red blood cells - unusually weak or tired, fainting spells, lightheadedness °-breathing problems °-dark  urine °-dizziness °-palpitations °-swelling of the ankles, feet, hands °-trouble passing urine or change in the amount of urine °-weight gain °-yellowing of the eyes or skin °Side effects that usually do not require medical attention (report to your doctor or health care professional if they continue or are bothersome): °-changes in nail or skin color °-hair loss °-missed menstrual periods °-mouth sores °-nausea, vomiting °This list may not describe all possible side effects. Call your doctor for medical advice about side effects. You may report side effects to FDA at 1-800-FDA-1088. °Where should I keep my medicine? °This drug is given in a hospital or clinic and will not be stored at home. °NOTE: This sheet is a summary. It may not cover all possible information. If you have questions about this medicine, talk to your doctor, pharmacist, or health care provider. °© 2018 [employer] (2012-06-27 16:22:58) ° °

## 2017-01-09 NOTE — Progress Notes (Signed)
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  History of present illness (03/01/2016): Jose Compton is a 61 year old male, originally from [place], was admitted to [facility] yesterday for severe anemia. I was called to see the patient for pancytopenia.  He has had GERD for a few years, and takes over-the-counter medications such as Tums, no other significant past medical history. He does not see doctor regularly. He was last here 2 year ago for chest pain, and a CBC was normal, except elevated MCV at 108.1. He has been feeling fatigue, dizzy, for the past week, and came to the emergency room the night before yesterday after work, for worsening symptoms and the pre-syncope. CBC in the ED showed WBC 3.2, hemoglobin 4.4, platelet 85,000, MCV 114. Differential showed increased bands (>20%). CMP was unremarkable except a mild hypokalemia, and total bilirubin 1.5.  He has received 3u RBC, feels much better. He denies any chest pain, peripheral neuropathy or other neurological symptoms. He has mild intermittent headache in the past few days, and palpitation. He has lost about 40lbs in the past 6 week, for which he contributes to the acid reflux and eating less. His bowel movement is normal or slightly constipated. He denies melena, only had 1 episode of hematochezia 6 months ago. No other signs of bleeding.  He is widowed, has been the [place] for 18 years, he has 2 children in [place]. He works for a [employer], systems, his job is physically demanding. He denies any history of liver disease, prior transfusion, he drinks alcohol occasionally. He is a never smoker. No family history of malignancy or blood disorders.  CURRENT THERAPY: B12 1052mg IM weekly started on 03/01/2016, changed to monthly on 04/05/16 -Hold B12  injections and start B12 capsule after 01/11/17   INTERVAL HISTORY: Jose Compton for follow up. He presents to the clinic today reporting to having diarrhea due to medication after he eats. His stomach makes a lot of noise. He only eats a little bit to help that. He has acid reflux. Stomach pain in LLQ after eating. He would like to change form injection to B12 pills. He reports to not having a PCP but he will get one.   [place].     REVIEW OF SYSTEMS:   Constitutional: Denies fevers, chills or abnormal weight loss Eyes: Denies blurriness of vision Ears, nose, mouth, throat, and face: Denies mucositis or sore throat Respiratory: Denies cough, dyspnea or wheezes Cardiovascular: Denies palpitation, chest discomfort or lower extremity swelling Gastrointestinal:  Denies nausea, heartburn or change in bowel habits (+) acid reflux managed with Protonix (+) LLQ pain after eating (+) diarrhea Skin: Denies abnormal skin rashes Lymphatics: Denies new lymphadenopathy or easy bruising Neurological:Denies numbness, tingling or new weaknesses Behavioral/Psych: Mood is stable, no new changes  All other systems were reviewed with the patient and are negative.   MEDICAL HISTORY:  Past Medical History:  Diagnosis Date  . GERD (gastroesophageal reflux disease)     SURGICAL HISTORY: History reviewed. No pertinent surgical history.  I have reviewed the social history and family history with the patient and they are unchanged from previous note.  ALLERGIES:  has No Known Allergies.  MEDICATIONS:  Current Outpatient Prescriptions  Medication Sig Dispense  Refill  . acetaminophen (TYLENOL) 500 MG tablet Take 1 tablet (500 mg total) by mouth every 6 (six) hours as needed for headache. 30 tablet 0  . cyanocobalamin (,VITAMIN B-12,) 1000 MCG/ML injection Inject 1 mL (1,000 mcg total) into the muscle every 7 (seven) days. (Patient taking  differently: Inject 1,000 mcg into the muscle every 30 (thirty) days. ) 4 mL 0  . pantoprazole (PROTONIX) 40 MG tablet Take 1 tablet (40 mg total) by mouth 2 (two) times daily. 60 tablet 3  . Cyanocobalamin (B-12) 1000 MCG CAPS Take 1,000 mg by mouth daily. 30 capsule 3   No current facility-administered medications for this visit.     PHYSICAL EXAMINATION: ECOG PERFORMANCE STATUS: 0  Vitals:   01/11/17 0856  BP: 114/70  Pulse: 70  Resp: 17  Temp: 98.2 F (36.8 C)   Filed Weights   01/11/17 0856  Weight: 152 lb 8 oz (69.2 kg)    GENERAL:alert, no distress and comfortable SKIN: skin color, texture, turgor are normal, no rashes or significant lesions EYES: normal, Conjunctiva are pink and non-injected, sclera clear OROPHARYNX:no exudate, no erythema and lips, buccal mucosa, and tongue normal  NECK: supple, thyroid normal size, non-tender, without nodularity LYMPH:  no palpable lymphadenopathy in the cervical, axillary or inguinal LUNGS: clear to auscultation and percussion with normal breathing effort HEART: regular rate & rhythm and no murmurs and no lower extremity edema ABDOMEN:abdomen soft, non-tender and normal bowel sounds Musculoskeletal:no cyanosis of digits and no clubbing  NEURO: alert & oriented x 3 with fluent speech, no focal motor/sensory deficits   LABORATORY DATA:  I have reviewed the data as listed CBC Latest Ref Rng & Units 01/11/2017 11/16/2016 09/21/2016  WBC 4.0 - 10.3 10e3/uL 6.2 6.9 6.6  Hemoglobin 13.0 - 17.1 g/dL 14.4 14.8 14.5  Hematocrit 38.4 - 49.9 % 43.8 43.6 43.7  Platelets 140 - 400 10e3/uL 218 228 225     CMP Latest Ref Rng & Units 03/08/2016 03/03/2016 03/01/2016  Glucose 70 - 140 mg/dl 112 97 97  BUN 7.0 - 26.0 mg/dL 16.'9 12 10  '$ Creatinine 0.7 - 1.3 mg/dL 0.8 0.69 0.83  Sodium 136 - 145 mEq/L 138 137 140  Potassium 3.5 - 5.1 mEq/L 3.9 3.7 3.8  Chloride 101 - 111 mmol/L - 109 115(H)  CO2 22 - 29 mEq/L 25 24 20(L)  Calcium 8.4 - 10.4 mg/dL  9.3 8.0(L) 8.0(L)  Total Protein 6.4 - 8.3 g/dL 7.5 - -  Total Bilirubin 0.20 - 1.20 mg/dL 0.88 - -  Alkaline Phos 40 - 150 U/L 91 - -  AST 5 - 34 U/L 32 - -  ALT 0 - 55 U/L 30 - -      RADIOGRAPHIC STUDIES: I have personally reviewed the radiological images as listed and agreed with the findings in the report. No results found.   ASSESSMENT & PLAN: 61 year old male, with PMH of GERD, otherwise healthy and fit, origin from [place], presented with symptomatic anemia.  1.B12 deficient anemia -His anemia has near resolved, hemoglobin 13.8 today. He responds to very well to B12 injection -His leukopenia and thrombocytopenia has resolved also, indicating good response to B12 injection. -I do not think he needs a bone marrow biopsy, since the cytopenias responded well -His intrinsic factor antibody was negative, it's unlikely pernicious anemia  -We previously discussed the option of continued B-12 injection monthly versus trial of oral B-12, he opted B12 injection. -Continue B12 injection monthly -We discussed his labs and they are  fine to continue B12 injections.  -He would like to change to B12 pill capsule -We discussed taking B12 over counter pill 1,'000mg'$  daily. We will monitor his B12 levels on the pill. He will get one more B12 injection today. -He will be in [place] for a month so we will f/u when he returns in 8 weeks  2. GERD -He'll continue Protonix. I previously refilled Protonix -I previously encouraged him to have a primary care physician  -Last EGD 06/03/2006 with Dr. Ardis Hughs was unremarkable -I'll ask Dr. Ardis Hughs to see if he needs a repeated EGD -Due to acid reflux he is having stomach issues with his food and medication interaction -I highly suggest he do a colonoscopy to screen -I will refer him back to Dr. Ardis Hughs for endoscopy  Plan: -f/u in 3 months -labs a few days before f/u -GI referral -Order B12 Pill -Will get B12 injection today, and will hold it afterwards      All questions were answered. The patient knows to call the clinic with any problems, questions or concerns. No barriers to learning was detected.  I spent 15 minutes counseling the patient face to face. The total time spent in the appointment was 20 minutes and more than 50% was on counseling and review of test results  This document serves as a record of services personally performed by Truitt Merle, MD. It was created on her behalf by Joslyn Devon, a trained medical scribe. The creation of this record is based on the scribe's personal observations and the provider's statements to them. This document has been checked and approved by the attending provider.     Truitt Merle, MD 01/11/2017 10:12 AM

## 2017-01-11 ENCOUNTER — Ambulatory Visit (HOSPITAL_BASED_OUTPATIENT_CLINIC_OR_DEPARTMENT_OTHER): Payer: BLUE CROSS/BLUE SHIELD

## 2017-01-11 ENCOUNTER — Encounter: Payer: Self-pay | Admitting: Hematology

## 2017-01-11 ENCOUNTER — Other Ambulatory Visit (HOSPITAL_BASED_OUTPATIENT_CLINIC_OR_DEPARTMENT_OTHER): Payer: BLUE CROSS/BLUE SHIELD

## 2017-01-11 ENCOUNTER — Ambulatory Visit (HOSPITAL_BASED_OUTPATIENT_CLINIC_OR_DEPARTMENT_OTHER): Payer: BLUE CROSS/BLUE SHIELD | Admitting: Hematology

## 2017-01-11 VITALS — BP 114/70 | HR 70 | Temp 98.2°F | Resp 17 | Ht 62.0 in | Wt 152.5 lb

## 2017-01-11 DIAGNOSIS — E538 Deficiency of other specified B group vitamins: Secondary | ICD-10-CM | POA: Diagnosis not present

## 2017-01-11 DIAGNOSIS — D519 Vitamin B12 deficiency anemia, unspecified: Secondary | ICD-10-CM

## 2017-01-11 DIAGNOSIS — K219 Gastro-esophageal reflux disease without esophagitis: Secondary | ICD-10-CM

## 2017-01-11 LAB — CBC & DIFF AND RETIC
BASO%: 0.3 % (ref 0.0–2.0)
BASOS ABS: 0 10*3/uL (ref 0.0–0.1)
EOS%: 2.3 % (ref 0.0–7.0)
Eosinophils Absolute: 0.1 10*3/uL (ref 0.0–0.5)
HCT: 43.8 % (ref 38.4–49.9)
HGB: 14.4 g/dL (ref 13.0–17.1)
IMMATURE RETIC FRACT: 7 % (ref 3.00–10.60)
LYMPH%: 27.7 % (ref 14.0–49.0)
MCH: 30.6 pg (ref 27.2–33.4)
MCHC: 32.9 g/dL (ref 32.0–36.0)
MCV: 93.2 fL (ref 79.3–98.0)
MONO#: 0.5 10*3/uL (ref 0.1–0.9)
MONO%: 8.3 % (ref 0.0–14.0)
NEUT#: 3.8 10*3/uL (ref 1.5–6.5)
NEUT%: 61.4 % (ref 39.0–75.0)
PLATELETS: 218 10*3/uL (ref 140–400)
RBC: 4.7 10*6/uL (ref 4.20–5.82)
RDW: 13.6 % (ref 11.0–14.6)
RETIC CT ABS: 72.85 10*3/uL (ref 34.80–93.90)
Retic %: 1.55 % (ref 0.80–1.80)
WBC: 6.2 10*3/uL (ref 4.0–10.3)
lymph#: 1.7 10*3/uL (ref 0.9–3.3)

## 2017-01-11 MED ORDER — B-12 1000 MCG PO CAPS: 1000.0000 mg | ORAL_CAPSULE | Freq: Every day | ORAL | 3 refills | Status: AC

## 2017-01-11 MED ORDER — CYANOCOBALAMIN 1000 MCG/ML IJ SOLN
1000.0000 ug | Freq: Once | INTRAMUSCULAR | Status: AC
Start: 2017-01-11 — End: 2017-01-11
  Administered 2017-01-11: 1000 ug via INTRAMUSCULAR

## 2017-01-11 NOTE — Patient Instructions (Signed)

## 2017-09-19 ENCOUNTER — Ambulatory Visit: Payer: BLUE CROSS/BLUE SHIELD | Admitting: Physician Assistant

## 2017-09-19 VITALS — BP 128/80 | HR 95 | Temp 98.4°F | Resp 16 | Ht 62.0 in | Wt 151.0 lb

## 2017-09-19 DIAGNOSIS — J029 Acute pharyngitis, unspecified: Secondary | ICD-10-CM | POA: Diagnosis not present

## 2017-09-19 DIAGNOSIS — J019 Acute sinusitis, unspecified: Secondary | ICD-10-CM

## 2017-09-19 LAB — POCT RAPID STREP A (OFFICE): Rapid Strep A Screen: NEGATIVE

## 2017-09-19 MED ORDER — DOXYCYCLINE HYCLATE 100 MG PO CAPS: 100.0000 mg | ORAL_CAPSULE | Freq: Two times a day (BID) | ORAL | 0 refills | Status: AC

## 2017-09-19 MED ORDER — IPRATROPIUM BROMIDE 0.03 % NA SOLN: 2.0000 | Freq: Two times a day (BID) | NASAL | 0 refills | Status: AC

## 2017-09-19 NOTE — Progress Notes (Signed)
PRIMARY CARE AT [facility] [address] [place] [address] [phone]  Date:  09/19/2017   Name:  Jose Compton   DOB:  [birth year]   MRN:  [id number]  PCP:  Patient, No Pcp Per    History of Present Illness:  Jose Compton is a 62 y.o. male patient who presents to PCP with  Chief Complaint  Patient presents with  . Sinusitis    x 4 days/ nasal congestion and sinus headache  . Cough    chest congestion 4 days  . Sore Throat    x 4 days     4 days ago with nasal congestion. Frontal sinus pain. Thick mucus from nostrils Cough with some production. No sob or dyspnea. He is not hydrating well.   Patient Active Problem List   Diagnosis Date Noted  . Deficiency anemia 03/01/2016  . B12 deficiency anemia 03/01/2016  . Anemia 02/29/2016  . Acute GI bleeding 02/29/2016  . Pancytopenia (HCC) 02/29/2016  . Left flank pain 02/29/2016    Past Medical History:  Diagnosis Date  . GERD (gastroesophageal reflux disease)     No past surgical history on file.  Social History   Tobacco Use  . Smoking status: Never Smoker  . Smokeless tobacco: Never Used  Substance Use Topics  . Alcohol use: No  . Drug use: No    Family History  Problem Relation Age of Onset  . Stroke Mother   . Cancer Sister        pancreatic cancer    No Known Allergies  Medication list has been reviewed and updated.  Current Outpatient Medications on File Prior to Visit  Medication Sig Dispense Refill  . acetaminophen (TYLENOL) 500 MG tablet Take 1 tablet (500 mg total) by mouth every 6 (six) hours as needed for headache. 30 tablet 0  . Cyanocobalamin (B-12) 1000 MCG CAPS Take 1,000 mg by mouth daily. 30 capsule 3  . pantoprazole (PROTONIX) 40 MG tablet Take 1 tablet (40 mg total) by mouth 2 (two) times daily. 60 tablet 3  . cyanocobalamin (,VITAMIN B-12,) 1000 MCG/ML injection Inject 1 mL (1,000 mcg total) into the muscle every 7 (seven) days. (Patient not taking: Reported on 09/19/2017) 4  mL 0   No current facility-administered medications on file prior to visit.     ROS ROS otherwise unremarkable unless listed above.  Physical Examination: BP 128/80   Pulse 95   Temp 98.4 F (36.9 C) (Oral)   Resp 16   Ht 5\' 2"  (1.575 m)   Wt 151 lb (68.5 kg)   SpO2 95%   BMI 27.62 kg/m  Ideal Body Weight: Weight in (lb) to have BMI = 25: 136.4  Physical Exam  Constitutional: He is oriented to person, place, and time. He appears well-developed and well-nourished. No distress.  HENT:  Head: Normocephalic and atraumatic.  Right Ear: Tympanic membrane, external ear and ear canal normal.  Left Ear: Tympanic membrane, external ear and ear canal normal.  Nose: Mucosal edema and rhinorrhea present. Right sinus exhibits no maxillary sinus tenderness and no frontal sinus tenderness. Left sinus exhibits no maxillary sinus tenderness and no frontal sinus tenderness.  Mouth/Throat: No uvula swelling. No oropharyngeal exudate, posterior oropharyngeal edema or posterior oropharyngeal erythema.  Eyes: Conjunctivae, EOM and lids are normal. Pupils are equal, round, and reactive to light. Right eye exhibits normal extraocular motion. Left eye exhibits normal extraocular motion.  Neck: Trachea normal and full passive range of motion without pain. No edema  and no erythema present.  Cardiovascular: Normal rate.  Pulmonary/Chest: Effort normal. No respiratory distress. He has no decreased breath sounds. He has no wheezes. He has no rhonchi.  Neurological: He is alert and oriented to person, place, and time.  Skin: Skin is warm and dry. He is not diaphoretic.  Psychiatric: He has a normal mood and affect. His behavior is normal.    Results for orders placed or performed in visit on 09/19/17  POCT rapid strep A  Result Value Ref Range   Rapid Strep A Screen Negative Negative     Assessment and Plan: Jose Compton is a 62 y.o. male who is here today for cc of  Chief Complaint  Patient  presents with  . Sinusitis    x 4 days/ nasal congestion and sinus headache  . Cough    chest congestion 4 days  . Sore Throat    x 4 days   Acute non-recurrent sinusitis, unspecified location - Plan: doxycycline (VIBRAMYCIN) 100 MG capsule, ipratropium (ATROVENT) 0.03 % nasal spray  Sore throat - Plan: POCT rapid strep A  Trena PlattStephanie Jaselle Pryer, PA-C [facility] 2/5/20195:06 PM

## 2017-09-19 NOTE — Patient Instructions (Addendum)
Please hydrate well with water 64oz. You can take ibuprofen or tylenol for pain.  For instance, you can take ibuprofen 600mg  every 8 hours for no more than 7 days.     You can continue the mucinex.  Sinusitis, Adult Sinusitis is soreness and inflammation of your sinuses. Sinuses are hollow spaces in the bones around your face. They are located:  Around your eyes.  In the middle of your forehead.  Behind your nose.  In your cheekbones.  Your sinuses and nasal passages are lined with a stringy fluid (mucus). Mucus normally drains out of your sinuses. When your nasal tissues get inflamed or swollen, the mucus can get trapped or blocked so air cannot flow through your sinuses. This lets bacteria, viruses, and funguses grow, and that leads to infection. Follow these instructions at home: Medicines  Take, use, or apply over-the-counter and prescription medicines only as told by your doctor. These may include nasal sprays.  If you were prescribed an antibiotic medicine, take it as told by your doctor. Do not stop taking the antibiotic even if you start to feel better. Hydrate and Humidify  Drink enough water to keep your pee (urine) clear or pale yellow.  Use a cool mist humidifier to keep the humidity level in your home above 50%.  Breathe in steam for 10-15 minutes, 3-4 times a day or as told by your doctor. You can do this in the bathroom while a hot shower is running.  Try not to spend time in cool or dry air. Rest  Rest as much as possible.  Sleep with your head raised (elevated).  Make sure to get enough sleep each night. General instructions  Put a warm, moist washcloth on your face 3-4 times a day or as told by your doctor. This will help with discomfort.  Wash your hands often with soap and water. If there is no soap and water, use hand sanitizer.  Do not smoke. Avoid being around people who are smoking (secondhand smoke).  Keep all follow-up visits as told by your  doctor. This is important. Contact a doctor if:  You have a fever.  Your symptoms get worse.  Your symptoms do not get better within 10 days. Get help right away if:  You have a very bad headache.  You cannot stop throwing up (vomiting).  You have pain or swelling around your face or eyes.  You have trouble seeing.  You feel confused.  Your neck is stiff.  You have trouble breathing. This information is not intended to replace advice given to you by your health care provider. Make sure you discuss any questions you have with your health care provider. Document Released: 01/30/2008 Document Revised: 04/08/2016 Document Reviewed: 06/08/2015 Elsevier Interactive Patient Education  2018 [employer]    IF you received an x-ray today, you will receive an invoice from [facility]. Please contact [facility] at [phone] with questions or concerns regarding your invoice.   IF you received labwork today, you will receive an invoice from [facility]. Please contact [facility] at [phone] with questions or concerns regarding your invoice.   Our billing staff will not be able to assist you with questions regarding bills from these companies.  You will be contacted with the lab results as soon as they are available. The fastest way to get your results is to activate your My Chart account. Instructions are located on the last page of this paperwork. If you have not heard from us regarding  the results in 2 weeks, please contact this office.

## 2017-11-25 ENCOUNTER — Encounter: Payer: Self-pay | Admitting: Physician Assistant

## 2018-02-22 IMAGING — DX DG CHEST 1V PORT
1 series · 1 of 1 positions shown · non-contrast
Comparison: None.

CLINICAL DATA: Acute onset of GI bleeding.  Initial encounter.

EXAM:
PORTABLE CHEST 1 VIEW

[chest ap]
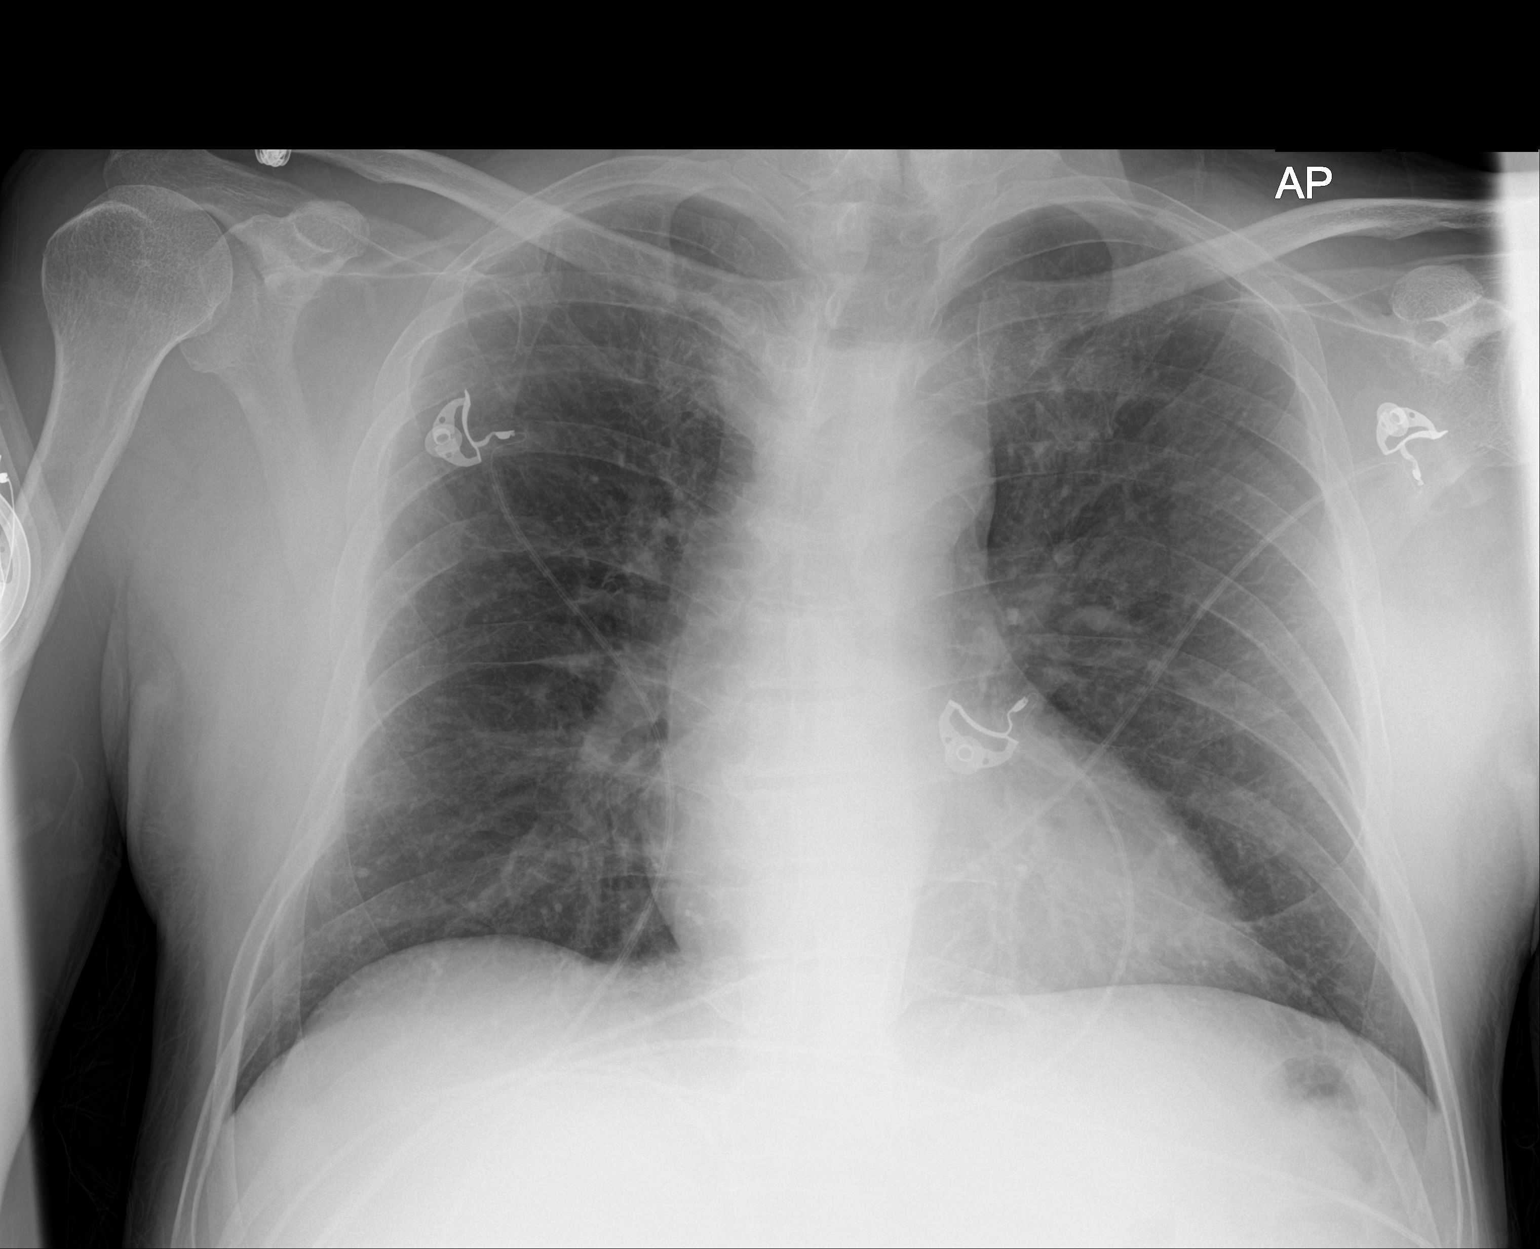

[1 of 1 positions shown; findings below may reference images not displayed]

FINDINGS: The lungs are well-aerated. Mild vascular congestion is noted. There
is no evidence of focal opacification, pleural effusion or
pneumothorax.

The cardiomediastinal silhouette is within normal limits. No acute
osseous abnormalities are seen.
IMPRESSION: Mild vascular congestion noted.  Lungs remain grossly clear.
